# Patient Record
Sex: Female | Born: 1945 | Race: Black or African American | Hispanic: No | Smoking: Never smoker
Health system: Southern US, Community
[De-identification: ages and names within clinical notes are randomized; demographics above are authoritative.]

## PROBLEM LIST (undated history)

## (undated) DIAGNOSIS — I10 Essential (primary) hypertension: Secondary | ICD-10-CM

## (undated) DIAGNOSIS — R011 Cardiac murmur, unspecified: Secondary | ICD-10-CM

## (undated) DIAGNOSIS — M75102 Unspecified rotator cuff tear or rupture of left shoulder, not specified as traumatic: Secondary | ICD-10-CM

## (undated) DIAGNOSIS — R0789 Other chest pain: Secondary | ICD-10-CM

## (undated) DIAGNOSIS — M545 Low back pain, unspecified: Secondary | ICD-10-CM

## (undated) DIAGNOSIS — M1990 Unspecified osteoarthritis, unspecified site: Secondary | ICD-10-CM

## (undated) DIAGNOSIS — R9439 Abnormal result of other cardiovascular function study: Secondary | ICD-10-CM

## (undated) DIAGNOSIS — I8390 Asymptomatic varicose veins of unspecified lower extremity: Secondary | ICD-10-CM

## (undated) DIAGNOSIS — I82409 Acute embolism and thrombosis of unspecified deep veins of unspecified lower extremity: Secondary | ICD-10-CM

## (undated) DIAGNOSIS — I83893 Varicose veins of bilateral lower extremities with other complications: Secondary | ICD-10-CM

## (undated) DIAGNOSIS — R600 Localized edema: Secondary | ICD-10-CM

## (undated) DIAGNOSIS — E782 Mixed hyperlipidemia: Secondary | ICD-10-CM

## (undated) DIAGNOSIS — I82431 Acute embolism and thrombosis of right popliteal vein: Secondary | ICD-10-CM

## (undated) DIAGNOSIS — I83899 Varicose veins of unspecified lower extremities with other complications: Secondary | ICD-10-CM

## (undated) DIAGNOSIS — M12812 Other specific arthropathies, not elsewhere classified, left shoulder: Secondary | ICD-10-CM

## (undated) HISTORY — DX: Essential (primary) hypertension: I10

## (undated) HISTORY — PX: ABDOMINAL HYSTERECTOMY: SHX81

## (undated) HISTORY — PX: TUBAL LIGATION: SHX77

## (undated) HISTORY — DX: Acute embolism and thrombosis of unspecified deep veins of unspecified lower extremity: I82.409

## (undated) HISTORY — PX: CATARACT EXTRACTION W/ INTRAOCULAR LENS IMPLANT: SHX1309

## (undated) HISTORY — PX: JOINT REPLACEMENT: SHX530

## (undated) HISTORY — DX: Low back pain, unspecified: M54.50

## (undated) HISTORY — PX: SHOULDER ARTHROSCOPY: SHX128

## (undated) HISTORY — DX: Low back pain: M54.5

## (undated) HISTORY — PX: VARICOSE VEIN SURGERY: SHX832

---
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
## 2004-10-29 ENCOUNTER — Ambulatory Visit: Payer: Self-pay | Admitting: Family Medicine

## 2004-12-21 ENCOUNTER — Ambulatory Visit: Payer: Self-pay | Admitting: Family Medicine

## 2005-08-30 ENCOUNTER — Ambulatory Visit: Payer: Self-pay | Admitting: Family Medicine

## 2005-10-07 ENCOUNTER — Ambulatory Visit: Payer: Self-pay | Admitting: Family Medicine

## 2005-10-25 ENCOUNTER — Ambulatory Visit: Payer: Self-pay | Admitting: Family Medicine

## 2008-03-28 ENCOUNTER — Encounter: Admission: RE | Admit: 2008-03-28 | Discharge: 2008-03-28 | Payer: Self-pay | Admitting: Neurosurgery

## 2008-06-13 ENCOUNTER — Encounter: Admission: RE | Admit: 2008-06-13 | Discharge: 2008-06-13 | Payer: Self-pay | Admitting: Neurosurgery

## 2009-03-10 ENCOUNTER — Encounter: Admission: RE | Admit: 2009-03-10 | Discharge: 2009-03-10 | Payer: Self-pay | Admitting: Neurosurgery

## 2009-09-14 IMAGING — CT CT NECK WO/W CM
5 of 11 series · 14 of 33 positions shown, 15 images · IV contrast ([ID] OMNI [id number])
Comparison: MRI scan of the cervical spine Jurgina Debnath dated
 02/06/2008 and postcontrast study dated 02/09/2008.
COMPARISON: MRI scan Jurgina Debnath as described above.

March 28, 2008 –DUPLICATE COPY for exam association in [facility]. No change from original report.
CLINICAL DATA: Abnormal MRI scan. Cervical neck pain.

 CT NECK WITHOUT AND WITH CONTRAST
TECHNIQUE: Multidetector CT imaging of the neck was performed
 without and with intravenous contrast.
 Contrast: 100 ml Omnipaque 300.
TECHNIQUE: Multidetector CT imaging of the chest was performed Rautha
 Andika Budi intravenous contrast administration.
 Contrast: And 100 ml Omnipaque 300

[Series 2: cervical spine · axial · 0.23mm/px · z∈[-170,-113]mm · 2 of 137 slices shown]
[im 46/137  bone]
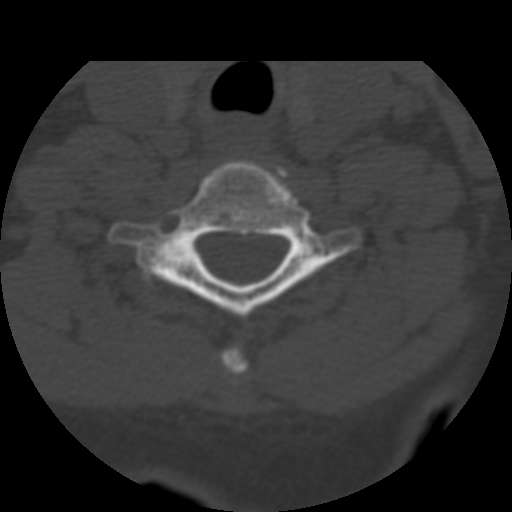
[im 91/137  bone]
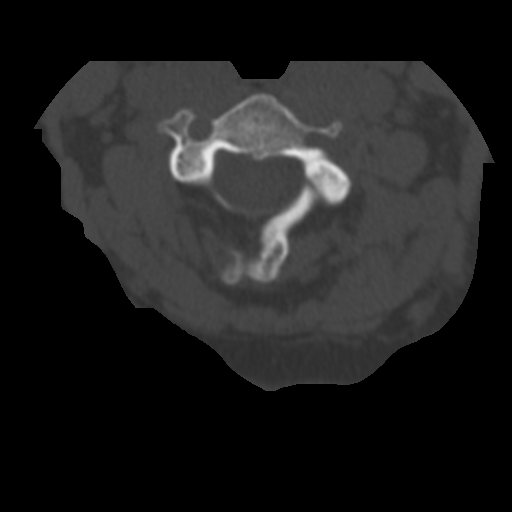

[Series 3: bone windows · axial · 0.23mm/px · z∈[-170,-113]mm · 2 of 137 slices shown]
[im 46/137  bone]
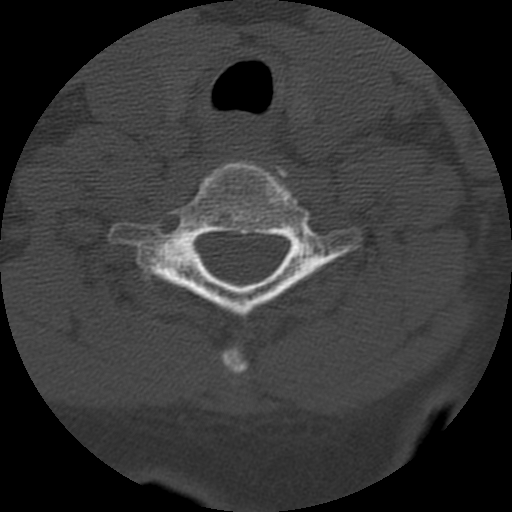
[im 91/137  bone]
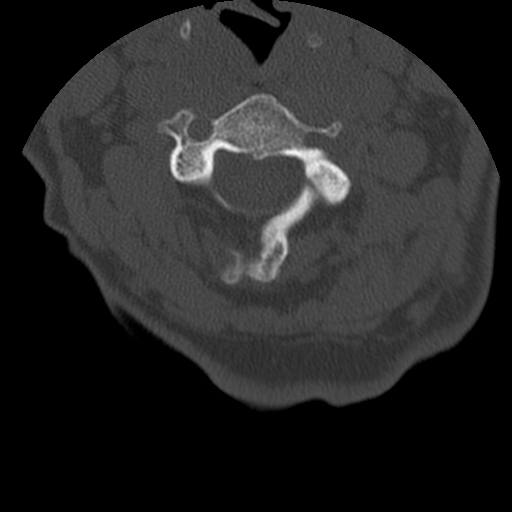

[Series 5: chest/neck · axial · 0.70mm/px · z∈[-424,-46]mm · 3 of 111 slices shown, 4 images]
[im 1/111  soft-tissue]
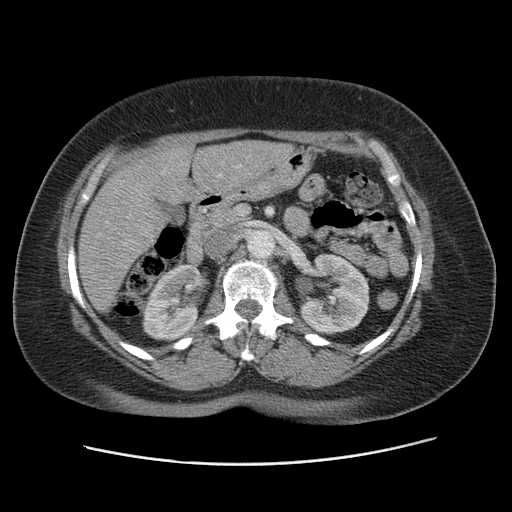
[im 1/111  bone]
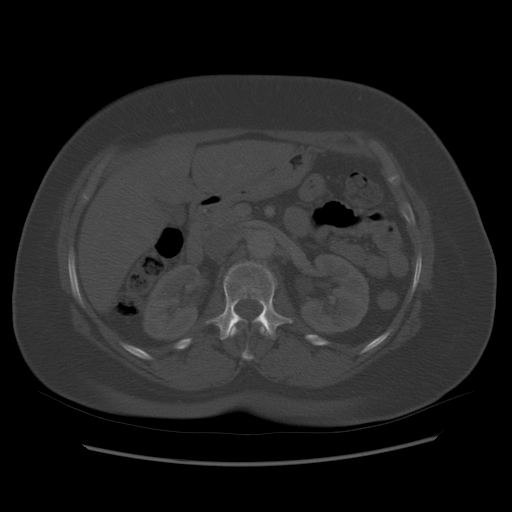
[im 56/111  bone]
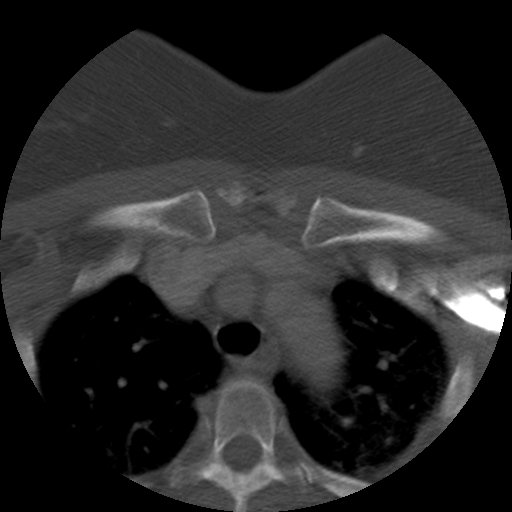
[im 111/111  bone]
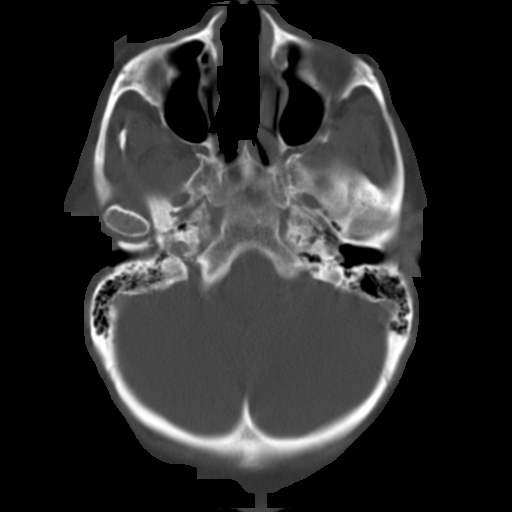

[Series 106: cor chest · coronal · 0.70mm/px · 2 of 102 slices shown]
[im 34/102  bone]
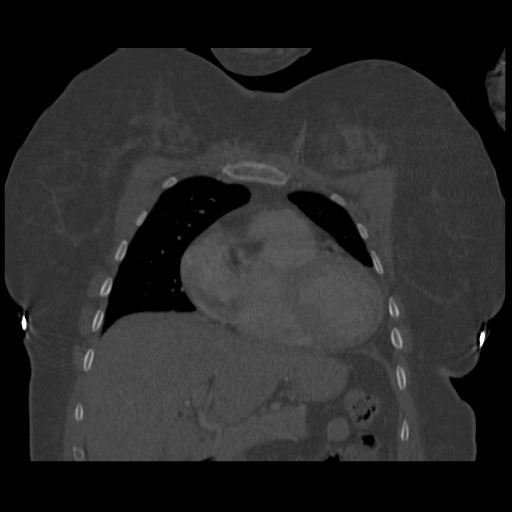
[im 68/102  bone]
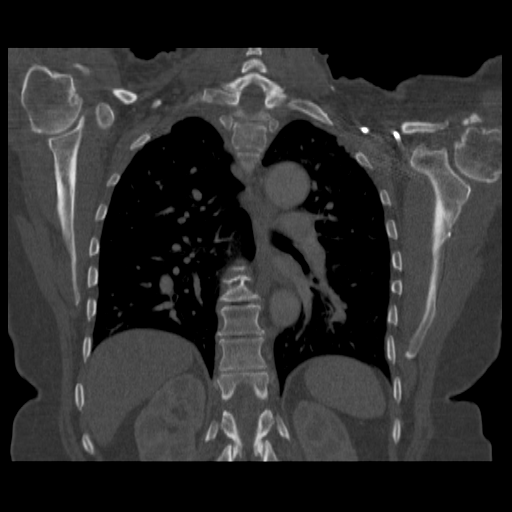

[Series 107: sag chest · sagittal · 0.70mm/px · 5 of 141 slices shown]
[im 24/141  bone]
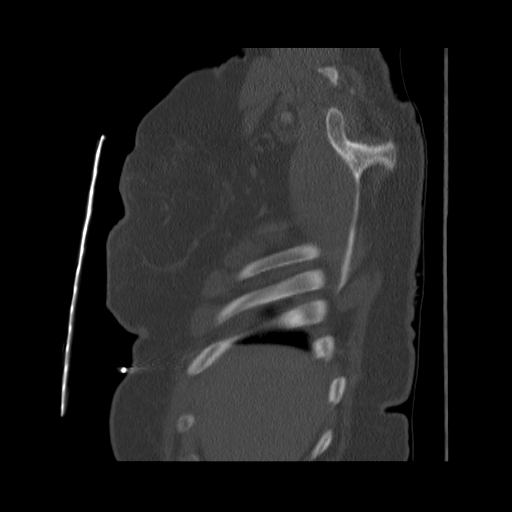
[im 47/141  bone]
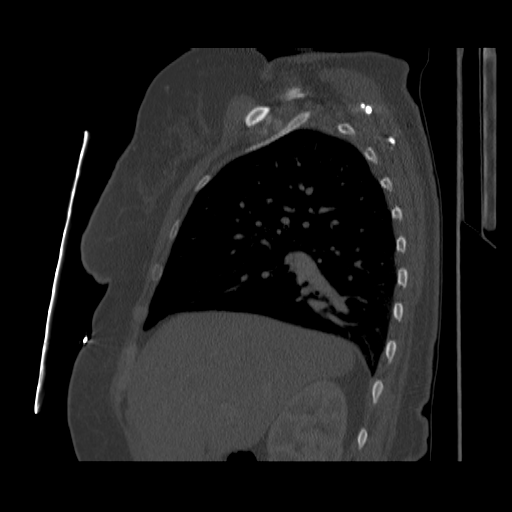
[im 71/141  bone]
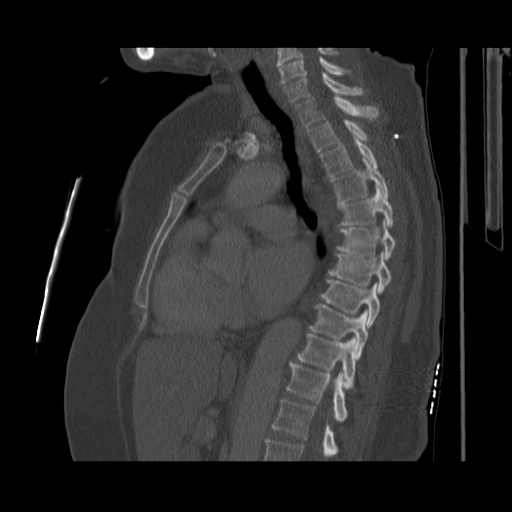
[im 94/141  bone]
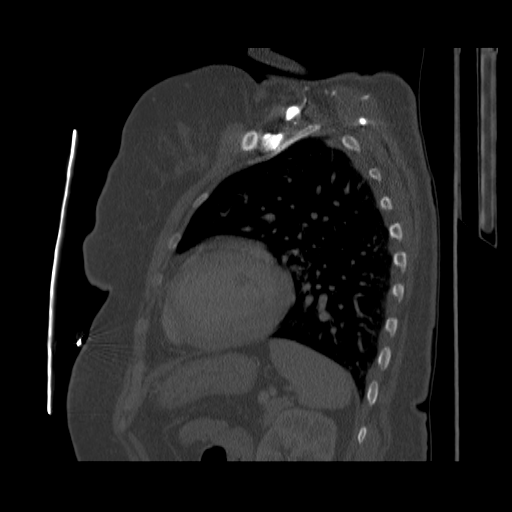
[im 117/141  bone]
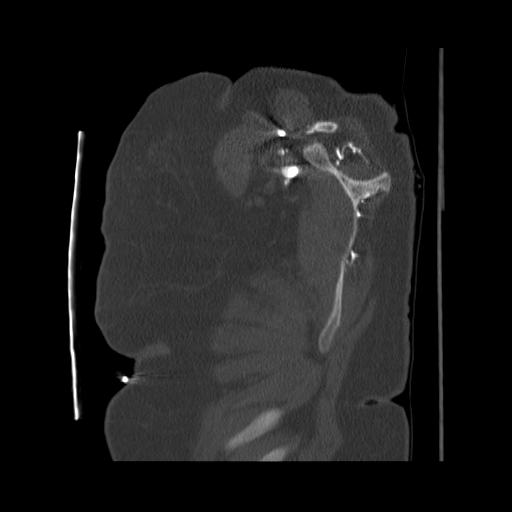

[14 of 33 positions shown; findings below may reference images not displayed]

FINDINGS: There is no evidence for apical mass on the neck CT. The
 previously identified inflammatory changes about the posterior
 elements of T1 and T2 are not well appreciated on this study. MRI
 is more sensitive and specific for such findings. No definite
 adjacent soft tissue mass is appreciated. Degenerative changes
 from C5-C7 are redemonstrated. No definite bone destruction is
 appreciated.

 The soft tissues of the neck demonstrate no significant mucosal or
 submucosal lesion. There is mild prominence of the palatine
 tonsils bilaterally without definite focal lesion. A 15 mm left
 jugulodigastric node is likely within normal limits. A smaller
 level II node is seen on the right. No other pathologically
 enlarged nodes are evident within the neck.
IMPRESSION: 1. Bone marrow and facet changes at T1-2 identified on recent MRI
 scan are not well appreciated on the study.
 2. No adjacent soft tissue mass or evidence for bone destruction.
 3. Mild cervical spondylosis as described.
 4. No other significant to soft tissue pathology in the neck.
 5. Mild prominence of the palatine tonsils bilaterally. These do
 not appear obstructive nor is there any focal lesion.

 CT CHEST WITH CONTRAST
FINDINGS: Heart size is mildly enlarged. There is no significant
 mediastinal or axillary adenopathy. No significant to apical or
 pleural based mass lesion is evident. Lungs are clear without
 focal nodule, mass, or airspace disease. Minimal dependent
 atelectasis is evident on the left. Bone windows demonstrate
 exaggerated thoracic kyphosis. No focal fracture is seen. There
 is no evidence for focal lytic or blastic lesion.
IMPRESSION: 1. No evidence for paraspinal or peri apical mass.
 2. Mild cardiomegaly without failure.
 3. No acute abnormality.

## 2010-05-13 ENCOUNTER — Ambulatory Visit: Payer: Self-pay | Admitting: Vascular Surgery

## 2010-05-13 ENCOUNTER — Encounter (INDEPENDENT_AMBULATORY_CARE_PROVIDER_SITE_OTHER): Payer: Self-pay | Admitting: Orthopedic Surgery

## 2010-05-29 ENCOUNTER — Inpatient Hospital Stay (HOSPITAL_COMMUNITY): Admission: RE | Admit: 2010-05-29 | Discharge: 2010-06-01 | Payer: Self-pay | Admitting: Orthopedic Surgery

## 2010-08-24 ENCOUNTER — Encounter: Payer: Self-pay | Admitting: Neurosurgery

## 2010-10-14 LAB — URINALYSIS, ROUTINE W REFLEX MICROSCOPIC
Bilirubin Urine: NEGATIVE
Glucose, UA: NEGATIVE mg/dL
Ketones, ur: NEGATIVE mg/dL
Leukocytes, UA: NEGATIVE
Nitrite: NEGATIVE
Protein, ur: NEGATIVE mg/dL
Specific Gravity, Urine: 1.017 (ref 1.005–1.030)
Urobilinogen, UA: 0.2 mg/dL (ref 0.0–1.0)
pH: 5 (ref 5.0–8.0)

## 2010-10-14 LAB — CBC
HCT: 26.7 % — ABNORMAL LOW (ref 36.0–46.0)
HCT: 29.5 % — ABNORMAL LOW (ref 36.0–46.0)
HCT: 30.7 % — ABNORMAL LOW (ref 36.0–46.0)
HCT: 38.6 % (ref 36.0–46.0)
Hemoglobin: 10.1 g/dL — ABNORMAL LOW (ref 12.0–15.0)
Hemoglobin: 10.6 g/dL — ABNORMAL LOW (ref 12.0–15.0)
Hemoglobin: 13.2 g/dL (ref 12.0–15.0)
Hemoglobin: 9.2 g/dL — ABNORMAL LOW (ref 12.0–15.0)
MCH: 30.1 pg (ref 26.0–34.0)
MCH: 30.1 pg (ref 26.0–34.0)
MCH: 30.8 pg (ref 26.0–34.0)
MCH: 31 pg (ref 26.0–34.0)
MCHC: 34.2 g/dL (ref 30.0–36.0)
MCHC: 34.2 g/dL (ref 30.0–36.0)
MCHC: 34.5 g/dL (ref 30.0–36.0)
MCHC: 34.5 g/dL (ref 30.0–36.0)
MCV: 88.1 fL (ref 78.0–100.0)
MCV: 88.1 fL (ref 78.0–100.0)
MCV: 89.3 fL (ref 78.0–100.0)
MCV: 89.8 fL (ref 78.0–100.0)
Platelets: 149 10*3/uL — ABNORMAL LOW (ref 150–400)
Platelets: 157 10*3/uL (ref 150–400)
Platelets: 162 10*3/uL (ref 150–400)
Platelets: 209 10*3/uL (ref 150–400)
RBC: 2.99 MIL/uL — ABNORMAL LOW (ref 3.87–5.11)
RBC: 3.35 MIL/uL — ABNORMAL LOW (ref 3.87–5.11)
RBC: 3.42 MIL/uL — ABNORMAL LOW (ref 3.87–5.11)
RBC: 4.38 MIL/uL (ref 3.87–5.11)
RDW: 13.5 % (ref 11.5–15.5)
RDW: 13.9 % (ref 11.5–15.5)
RDW: 14 % (ref 11.5–15.5)
RDW: 14.1 % (ref 11.5–15.5)
WBC: 12.8 10*3/uL — ABNORMAL HIGH (ref 4.0–10.5)
WBC: 5.5 10*3/uL (ref 4.0–10.5)
WBC: 8.6 10*3/uL (ref 4.0–10.5)
WBC: 8.9 10*3/uL (ref 4.0–10.5)

## 2010-10-14 LAB — COMPREHENSIVE METABOLIC PANEL
ALT: 13 U/L (ref 0–35)
AST: 18 U/L (ref 0–37)
Albumin: 3.4 g/dL — ABNORMAL LOW (ref 3.5–5.2)
Alkaline Phosphatase: 52 U/L (ref 39–117)
BUN: 11 mg/dL (ref 6–23)
CO2: 32 mEq/L (ref 19–32)
Calcium: 9.4 mg/dL (ref 8.4–10.5)
Chloride: 101 mEq/L (ref 96–112)
Creatinine, Ser: 0.68 mg/dL (ref 0.4–1.2)
GFR calc Af Amer: >60 mL/min (ref >60)
GFR calc non Af Amer: >60 mL/min (ref >60)
Glucose, Bld: 73 mg/dL (ref 70–99)
Potassium: 3.7 mEq/L (ref 3.5–5.1)
Sodium: 139 mEq/L (ref 135–145)
Total Bilirubin: 0.3 mg/dL (ref 0.3–1.2)
Total Protein: 7 g/dL (ref 6.0–8.3)

## 2010-10-14 LAB — URINE MICROSCOPIC-ADD ON

## 2010-10-14 LAB — URINE CULTURE
Colony Count: 6000
Culture  Setup Time: 201110271157

## 2010-10-14 LAB — BASIC METABOLIC PANEL
BUN: 6 mg/dL (ref 6–23)
BUN: 6 mg/dL (ref 6–23)
BUN: 7 mg/dL (ref 6–23)
CO2: 28 mEq/L (ref 19–32)
CO2: 29 mEq/L (ref 19–32)
CO2: 29 mEq/L (ref 19–32)
Calcium: 8.2 mg/dL — ABNORMAL LOW (ref 8.4–10.5)
Calcium: 8.2 mg/dL — ABNORMAL LOW (ref 8.4–10.5)
Calcium: 8.6 mg/dL (ref 8.4–10.5)
Chloride: 101 mEq/L (ref 96–112)
Chloride: 102 mEq/L (ref 96–112)
Chloride: 103 mEq/L (ref 96–112)
Creatinine, Ser: 0.64 mg/dL (ref 0.4–1.2)
Creatinine, Ser: 0.66 mg/dL (ref 0.4–1.2)
Creatinine, Ser: 0.71 mg/dL (ref 0.4–1.2)
GFR calc Af Amer: >60 mL/min (ref >60)
GFR calc Af Amer: >60 mL/min (ref >60)
GFR calc Af Amer: >60 mL/min (ref >60)
GFR calc non Af Amer: >60 mL/min (ref >60)
GFR calc non Af Amer: >60 mL/min (ref >60)
GFR calc non Af Amer: >60 mL/min (ref >60)
Glucose, Bld: 121 mg/dL — ABNORMAL HIGH (ref 70–99)
Glucose, Bld: 132 mg/dL — ABNORMAL HIGH (ref 70–99)
Glucose, Bld: 96 mg/dL (ref 70–99)
Potassium: 3.3 mEq/L — ABNORMAL LOW (ref 3.5–5.1)
Potassium: 3.5 mEq/L (ref 3.5–5.1)
Potassium: 3.5 mEq/L (ref 3.5–5.1)
Sodium: 135 mEq/L (ref 135–145)
Sodium: 136 mEq/L (ref 135–145)
Sodium: 137 mEq/L (ref 135–145)

## 2010-10-14 LAB — DIFFERENTIAL
Basophils Absolute: 0 10*3/uL (ref 0.0–0.1)
Basophils Relative: 0 % (ref 0–1)
Eosinophils Absolute: 0.1 10*3/uL (ref 0.0–0.7)
Eosinophils Relative: 2 % (ref 0–5)
Lymphocytes Relative: 30 % (ref 12–46)
Lymphs Abs: 1.7 10*3/uL (ref 0.7–4.0)
Monocytes Absolute: 0.4 10*3/uL (ref 0.1–1.0)
Monocytes Relative: 7 % (ref 3–12)
Neutro Abs: 3.3 10*3/uL (ref 1.7–7.7)
Neutrophils Relative %: 61 % (ref 43–77)

## 2010-10-14 LAB — ABO/RH
ABO/RH(D): B NEG
Weak D: POSITIVE

## 2010-10-14 LAB — APTT: aPTT: 31 seconds (ref 24–37)

## 2010-10-14 LAB — SURGICAL PCR SCREEN
MRSA, PCR: NEGATIVE
Staphylococcus aureus: NEGATIVE

## 2010-10-14 LAB — TYPE AND SCREEN
ABO/RH(D): B NEG
Antibody Screen: NEGATIVE
Weak D: POSITIVE

## 2010-10-14 LAB — PROTIME-INR
INR: 1.08 (ref 0.00–1.49)
Prothrombin Time: 14.2 seconds (ref 11.6–15.2)

## 2011-09-22 ENCOUNTER — Other Ambulatory Visit: Payer: Self-pay | Admitting: Neurosurgery

## 2011-09-22 DIAGNOSIS — M48061 Spinal stenosis, lumbar region without neurogenic claudication: Secondary | ICD-10-CM

## 2011-09-22 DIAGNOSIS — M545 Low back pain: Secondary | ICD-10-CM

## 2011-09-30 ENCOUNTER — Ambulatory Visit
Admission: RE | Admit: 2011-09-30 | Discharge: 2011-09-30 | Disposition: A | Discharge status: Home / Self Care | Payer: BC Managed Care – PPO | Source: Ambulatory Visit | Attending: Neurosurgery | Admitting: Neurosurgery

## 2011-09-30 DIAGNOSIS — M48061 Spinal stenosis, lumbar region without neurogenic claudication: Secondary | ICD-10-CM

## 2011-09-30 DIAGNOSIS — M545 Low back pain: Secondary | ICD-10-CM

## 2013-11-26 ENCOUNTER — Other Ambulatory Visit: Payer: Self-pay | Admitting: *Deleted

## 2013-11-26 DIAGNOSIS — I80299 Phlebitis and thrombophlebitis of other deep vessels of unspecified lower extremity: Secondary | ICD-10-CM

## 2013-11-26 DIAGNOSIS — I8000 Phlebitis and thrombophlebitis of superficial vessels of unspecified lower extremity: Secondary | ICD-10-CM

## 2013-11-26 DIAGNOSIS — M7989 Other specified soft tissue disorders: Secondary | ICD-10-CM

## 2013-11-30 ENCOUNTER — Encounter: Payer: Self-pay | Admitting: Surgery

## 2013-12-28 ENCOUNTER — Encounter: Payer: Self-pay | Admitting: Surgery

## 2013-12-31 ENCOUNTER — Encounter: Payer: Self-pay | Admitting: Surgery

## 2013-12-31 ENCOUNTER — Ambulatory Visit (HOSPITAL_COMMUNITY)
Admission: RE | Admit: 2013-12-31 | Discharge: 2013-12-31 | Disposition: A | Discharge status: Home / Self Care | Payer: BC Managed Care – PPO | Source: Ambulatory Visit | Attending: Surgery | Admitting: Surgery

## 2013-12-31 ENCOUNTER — Ambulatory Visit (INDEPENDENT_AMBULATORY_CARE_PROVIDER_SITE_OTHER): Payer: BC Managed Care – PPO | Admitting: Surgery

## 2013-12-31 VITALS — BP 144/79 | HR 59 | Ht 61.0 in | Wt 178.0 lb

## 2013-12-31 DIAGNOSIS — M7989 Other specified soft tissue disorders: Secondary | ICD-10-CM

## 2013-12-31 DIAGNOSIS — I8000 Phlebitis and thrombophlebitis of superficial vessels of unspecified lower extremity: Secondary | ICD-10-CM | POA: Insufficient documentation

## 2013-12-31 DIAGNOSIS — I80299 Phlebitis and thrombophlebitis of other deep vessels of unspecified lower extremity: Secondary | ICD-10-CM | POA: Diagnosis present

## 2013-12-31 NOTE — Progress Notes (Signed)
Patient name: Lauren Moon MRN: [id number] DOB: 01-23-1946 Sex: female   Referred by: Dr. Garlon Hatchet  Reason for referral:  Chief Complaint  Patient presents with  . New Evaluation    Lt leg edema, hx of DVT     HISTORY OF PRESENT ILLNESS: This is a 68 year old female referred for leg swelling.  The patient states that in February of 2015, she developed a blood clot.  Ultrasound shows that this was in the left popliteal vein.  She was treated with oral anticoagulation.  The goal was for 3 months however I'm not sure if she took a full 3 month course.  The patient does report one previous DVT about 10 years ago.  This was associated with a large ulcer on her left ankle which ultimately healed.  The patient still complains of significant leg swelling.  There were no obvious factors associated with her blood clot.  She was actually at work.  She denied a long travel history or prolonged sitting.  She has never had a pulmonary embolism.  The patient is medically managed for hypertension.  She is a nonsmoker.     Past Medical History  Diagnosis Date  . Hypertension   . Acute venous embolism and thrombosis of unspecified deep vessels of lower extremity   . Lumbago     Past Surgical History  Procedure Laterality Date  . Joint replacement Bilateral     knee    History   Social History  . Marital Status: Divorced    Spouse Name: N/A    Number of Children: N/A  . Years of Education: N/A   Occupational History  . Not on file.   Social History Main Topics  . Smoking status: Never Smoker   . Smokeless tobacco: Never Used  . Alcohol Use: No  . Drug Use: No  . Sexual Activity: Not on file   Other Topics Concern  . Not on file   Social History Narrative  . No narrative on file    Family History  Problem Relation Age of Onset  . Heart disease Mother     before age 67  . Hypertension Mother   . Varicose Veins Mother   . Cancer Sister   . Diabetes Sister   . Heart  disease Sister     before age 60  . Hypertension Sister   . Peripheral vascular disease Sister   . Bleeding Disorder Sister   . Cancer Brother   . Heart disease Brother     before age 25  . Hypertension Brother     Allergies as of 12/31/2013 - Review Complete 12/31/2013  Allergen Reaction Noted  . Penicillins  11/30/2013    Current Outpatient Prescriptions on File Prior to Visit  Medication Sig Dispense Refill  . Amlodipine-Valsartan-HCTZ (EXFORGE HCT) 5-160-25 MG TABS Take by mouth daily.      . Calcium Carbonate (CALTRATE 600 PO) Take by mouth 2 (two) times daily.      . diclofenac (VOLTAREN) 75 MG EC tablet Take 75 mg by mouth 2 (two) times daily.      . rivaroxaban (XARELTO) 20 MG TABS tablet Take 20 mg by mouth daily with supper.       No current facility-administered medications on file prior to visit.     REVIEW OF SYSTEMS: Please see history of present illness, otherwise all systems are negative  PHYSICAL EXAMINATION: General: The patient appears their stated age.  Vital signs are BP 144/79  Pulse 59  Ht 5\' 1"  (1.549 m)  Wt 178 lb (80.74 kg)  BMI 33.65 kg/m2  SpO2 100% HEENT:  No gross abnormalities Pulmonary: Respirations are non-labored Musculoskeletal: There are no major deformities.   Neurologic: No focal weakness or paresthesias are detected, Skin: Heel ulcer left medial malleolus. Psychiatric: The patient has normal affect. Cardiovascular: There is a regular rate and rhythm without significant murmur appreciated.  Palpable dorsalis pedis pulse bilaterally.  Edema, left leg  Diagnostic Studies:  Historical records were reviewed.  They showed ultrasound was ordered and reviewed.  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  There is deep vein reflux as well as reflux within the great saphenous and small saphenous vein on the left.  Maximum diameter of the great saphenous vein the left is  1.02.    Assessment:  Left leg DVT Plan: I discussed with the patient that she needs to undergo a full hypercoagulable workup, given that this is her second episode of deep vein thrombosis.  If this workup were to be positive, she will need lifelong anticoagulation.  I will refer to Dr. Garlon Hatchet to order these tests.  I have recommended that the patient be placed in 20-30 thigh-high compression stockings.  She will come back to our office in 3 months for consideration of laser ablation of her incompetent left greater saphenous vein.     Eldridge Abrahams, M.D. [facility] Office: [phone] Pager:  [phone]

## 2014-04-15 ENCOUNTER — Encounter: Payer: Self-pay | Admitting: Vascular Surgery

## 2014-04-16 ENCOUNTER — Other Ambulatory Visit: Payer: Self-pay | Admitting: *Deleted

## 2014-04-16 ENCOUNTER — Encounter: Payer: Self-pay | Admitting: Vascular Surgery

## 2014-04-16 ENCOUNTER — Ambulatory Visit (INDEPENDENT_AMBULATORY_CARE_PROVIDER_SITE_OTHER): Payer: BC Managed Care – PPO | Admitting: Vascular Surgery

## 2014-04-16 VITALS — BP 129/76 | HR 73 | Ht 61.0 in | Wt 171.6 lb

## 2014-04-16 DIAGNOSIS — I83893 Varicose veins of bilateral lower extremities with other complications: Secondary | ICD-10-CM

## 2014-04-16 DIAGNOSIS — I8390 Asymptomatic varicose veins of unspecified lower extremity: Secondary | ICD-10-CM

## 2014-04-16 DIAGNOSIS — M7989 Other specified soft tissue disorders: Secondary | ICD-10-CM

## 2014-04-16 HISTORY — DX: Varicose veins of bilateral lower extremities with other complications: I83.893

## 2014-04-16 HISTORY — DX: Asymptomatic varicose veins of unspecified lower extremity: I83.90

## 2014-04-16 NOTE — Progress Notes (Signed)
Subjective:     Patient ID: Lauren Moon, female   DOB: 1946-02-08, 68 y.o.   MRN: [id number]  HPI this 68 year old female returns for followup regarding her swollen left lower extremity. She has had an episode of DVT in February of this year and has evidence of partial obstruction of the left popliteal vein. She continues to have chronic edema in the left leg. She does have a remote history of a stasis ulcer in the left ankle about 10 years ago which healed. She has tried long leg elastic compression stockings 20-30 mm gradient as well as elevation and ibuprofen and this has not improved her swelling.  Past Medical History  Diagnosis Date  . Hypertension   . Acute venous embolism and thrombosis of unspecified deep vessels of lower extremity   . Lumbago     History  Substance Use Topics  . Smoking status: Never Smoker   . Smokeless tobacco: Never Used  . Alcohol Use: No    Family History  Problem Relation Age of Onset  . Heart disease Mother     before age 26  . Hypertension Mother   . Varicose Veins Mother   . Cancer Sister   . Diabetes Sister   . Heart disease Sister     before age 70  . Hypertension Sister   . Peripheral vascular disease Sister   . Bleeding Disorder Sister   . Cancer Brother   . Heart disease Brother     before age 72  . Hypertension Brother     Allergies  Allergen Reactions  . Penicillins     Current outpatient prescriptions:Amlodipine-Valsartan-HCTZ (EXFORGE HCT) 5-160-25 MG TABS, Take by mouth daily., Disp: , Rfl: ;  aspirin 81 MG tablet, Take 81 mg by mouth daily., Disp: , Rfl: ;  Calcium Carbonate (CALTRATE 600 PO), Take by mouth 2 (two) times daily., Disp: , Rfl: ;  diclofenac (VOLTAREN) 75 MG EC tablet, Take 75 mg by mouth 2 (two) times daily., Disp: , Rfl:  Multiple Vitamins-Minerals (MULTIVITAMIN WITH MINERALS) tablet, Take 1 tablet by mouth daily., Disp: , Rfl: ;  rivaroxaban (XARELTO) 20 MG TABS tablet, Take 20 mg by mouth daily with  supper., Disp: , Rfl:   BP 129/76  Pulse 73  Ht 5\' 1"  (1.549 m)  Wt 171 lb 9.6 oz (77.837 kg)  BMI 32.44 kg/m2  SpO2 100%  Body mass index is 32.44 kg/(m^2).           Review of Systems chest pain, dyspnea on exertion, PND, orthopnea, or hemoptysis.     Objective:   Physical Exam BP 129/76  Pulse 73  Ht 5\' 1"  (1.549 m)  Wt 171 lb 9.6 oz (77.837 kg)  BMI 32.44 kg/m2  SpO2 100%  General well-developed well-nourished female in no apparent stress alert and oriented x3 Left leg with chronic 1+ edema. Severe hyperpigmentation lower third left leg and evidence of healed ulcer medially. A few bulging varicosities in the medial thigh. 3+ dorsalis pedis pulse palpable.  There is evidence of gross reflux in the left great saphenous vein from the mid thigh up to the saphenofemoral junction and it is a large caliber fine. 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 Small saphenous vein is also incompetent.      Assessment:     Chronic swelling left leg with previous DVT and gross reflux left grade and small saphenous veins with partially  obstructive chronic left popliteal thrombus    Plan:     Plan leave patient should have laser ablation left great saphenous vein from midthigh up to near saphenofemoral junction to help relieve edema. Do not believe small saphenous vein should be treated because of its proximity to popliteal vein which has had DVT in the past. Will proceed with presurgical and to perform this in the near future.

## 2014-05-06 ENCOUNTER — Ambulatory Visit (INDEPENDENT_AMBULATORY_CARE_PROVIDER_SITE_OTHER): Payer: BC Managed Care – PPO | Admitting: Vascular Surgery

## 2014-05-06 ENCOUNTER — Encounter: Payer: Self-pay | Admitting: Vascular Surgery

## 2014-05-06 VITALS — BP 151/84 | HR 58 | Resp 16 | Ht 61.0 in | Wt 170.0 lb

## 2014-05-06 DIAGNOSIS — I83899 Varicose veins of unspecified lower extremities with other complications: Secondary | ICD-10-CM

## 2014-05-06 DIAGNOSIS — I83892 Varicose veins of left lower extremities with other complications: Secondary | ICD-10-CM

## 2014-05-06 HISTORY — DX: Varicose veins of unspecified lower extremity with other complications: I83.899

## 2014-05-06 NOTE — Progress Notes (Signed)
Subjective:     Patient ID: Lauren Moon, female   DOB: [birth year], 68 y.o.   MRN: [id number]  HPI this 68 year old female had laser ablation of the left great saphenous vein from the mid thigh to near the saphenofemoral junction performed under local tumescent anesthesia. A total of 735 J of energy were utilized. She tolerated the procedure well.  Review of Systems     Objective:   Physical Exam BP 151/84  Pulse 58  Resp 16  Ht 5\' 1"  (1.549 m)  Wt 170 lb (77.111 kg)  BMI 32.14 kg/m2       Assessment:     Well-tolerated laser ablation left great saphenous vein from mid thigh to near saphenofemoral junction performed under local tumescent anesthesia    Plan:     Return in one week for venous duplex exam left leg confirm closure left great saphenous vein This Will complete patient's treatment regimen

## 2014-05-06 NOTE — Progress Notes (Signed)
   Laser Ablation Procedure      Date: 05/06/2014    Lauren Moon DOB:16-Feb-1946  Consent signed: Yes  Surgeon:J.D. Kellie Simmering  Procedure: Laser Ablation: left Greater Saphenous Vein  BP 151/84  Pulse 58  Resp 16  Ht 5\' 1"  (1.549 m)  Wt 170 lb (77.111 kg)  BMI 32.14 kg/m2  Start time: 2:50   End time: 3:45  Tumescent Anesthesia: 200 cc 0.9% NaCl with 50 cc Lidocaine HCL with 1% Epi and 15 cc 8.4% NaHCO3  Local Anesthesia: 5 cc Lidocaine HCL and NaHCO3 (ratio 2:1)  Pulsed mode:15 watts, 563ms delay, 1.0 duration and Total energy: 733.53, Total pulses: 49, Total time: :49     Patient tolerated procedure well: Yes  Notes:   Description of Procedure:  After marking the course of the secondary varicosities, the patient was placed on the operating table in the supine position, and the left leg was prepped and draped in sterile fashion.   Local anesthetic was administered and under ultrasound guidance the saphenous vein was accessed with a micro needle and guide wire; then the mirco puncture sheath was place.  A guide wire was inserted saphenofemoral junction , followed by a 5 french sheath.  The position of the sheath and then the laser fiber below the junction was confirmed using the ultrasound.  Tumescent anesthesia was administered along the course of the saphenous vein using ultrasound guidance. The patient was placed in Trendelenburg position and protective laser glasses were placed on patient and staff, and the laser was fired at 15 watt pulsed mode advancing 1-2 mm per sec for a total of 733.53 joules.     Steri strips were applied to the stab wounds and ABD pads and thigh high compression stockings were applied.  Ace wrap bandages were applied over the phlebectomy sites and at the top of the saphenofemoral junction. Blood loss was less than 15 cc.  The patient ambulated out of the operating room having tolerated the procedure well.

## 2014-05-07 ENCOUNTER — Telehealth: Payer: Self-pay | Admitting: *Deleted

## 2014-05-07 NOTE — Telephone Encounter (Signed)
Pt doing well. Checked with her doctor and she can take Ibuprofen. Following all instructions. Reminded her of her fu appt next week.

## 2014-05-10 ENCOUNTER — Encounter: Payer: Self-pay | Admitting: Vascular Surgery

## 2014-05-13 ENCOUNTER — Encounter: Payer: Self-pay | Admitting: Vascular Surgery

## 2014-05-13 ENCOUNTER — Ambulatory Visit (HOSPITAL_COMMUNITY)
Admission: RE | Admit: 2014-05-13 | Discharge: 2014-05-13 | Disposition: A | Discharge status: Home / Self Care | Payer: BC Managed Care – PPO | Source: Ambulatory Visit | Attending: Vascular Surgery | Admitting: Vascular Surgery

## 2014-05-13 ENCOUNTER — Ambulatory Visit (INDEPENDENT_AMBULATORY_CARE_PROVIDER_SITE_OTHER): Payer: BC Managed Care – PPO | Admitting: Vascular Surgery

## 2014-05-13 VITALS — BP 151/81 | HR 58 | Resp 16 | Ht 61.0 in | Wt 170.0 lb

## 2014-05-13 DIAGNOSIS — I83892 Varicose veins of left lower extremities with other complications: Secondary | ICD-10-CM | POA: Insufficient documentation

## 2014-05-13 DIAGNOSIS — I8393 Asymptomatic varicose veins of bilateral lower extremities: Secondary | ICD-10-CM

## 2014-05-13 NOTE — Progress Notes (Signed)
Subjective:     Patient ID: Lauren Moon, female   DOB: 09/23/1945, 68 y.o.   MRN: [id number]  HPI this 68 year old female returns 1 week post laser ablation left great saphenous vein from midthigh to near the saphenofemoral junction performed for chronic venous hypertension with severe distal skin changes and chronic edema and pain. She states her leg feels much better already with less heaviness and tightness from the knee distally. She has been wearing her elastic compression stockings and elevating her leg and taking ibuprofen as instructed.  Past Medical History  Diagnosis Date  . Hypertension   . Acute venous embolism and thrombosis of unspecified deep vessels of lower extremity   . Lumbago     History  Substance Use Topics  . Smoking status: Never Smoker   . Smokeless tobacco: Never Used  . Alcohol Use: No    Family History  Problem Relation Age of Onset  . Heart disease Mother     before age 65  . Hypertension Mother   . Varicose Veins Mother   . Cancer Sister   . Diabetes Sister   . Heart disease Sister     before age 19  . Hypertension Sister   . Peripheral vascular disease Sister   . Bleeding Disorder Sister   . Cancer Brother   . Heart disease Brother     before age 31  . Hypertension Brother     Allergies  Allergen Reactions  . Penicillins     Current outpatient prescriptions:Amlodipine-Valsartan-HCTZ (EXFORGE HCT) 5-160-25 MG TABS, Take by mouth daily., Disp: , Rfl: ;  aspirin 81 MG tablet, Take 81 mg by mouth daily., Disp: , Rfl: ;  Calcium Carbonate (CALTRATE 600 PO), Take by mouth 2 (two) times daily., Disp: , Rfl: ;  diclofenac (VOLTAREN) 75 MG EC tablet, Take 75 mg by mouth 2 (two) times daily., Disp: , Rfl:  Multiple Vitamins-Minerals (MULTIVITAMIN WITH MINERALS) tablet, Take 1 tablet by mouth daily., Disp: , Rfl: ;  rivaroxaban (XARELTO) 20 MG TABS tablet, Take 20 mg by mouth daily with supper., Disp: , Rfl:   BP 151/81  Pulse 58  Resp 16  Ht  5\' 1"  (1.549 m)  Wt 170 lb (77.111 kg)  BMI 32.14 kg/m2  Body mass index is 32.14 kg/(m^2).          Review of Systems chest pain, dyspnea on exertion, PND, orthopnea, hemoptysis.     Objective:   Physical Exam BP 151/81  Pulse 58  Resp 16  Ht 5\' 1"  (1.549 m)  Wt 170 lb (77.111 kg)  BMI 32.14 kg/m2  General a well-developed well-nourished female in no apparent stress alert and oriented x3 Lungs no rhonchi or wheezing Cardiovascular regular rhythm no murmurs Left leg with moderate tenderness from mid thigh to near the saphenofemoral junction were ablation occurred. Mild ecchymosis. Left calf is soft with severe hyperpigmentation lower third of leg and 3+ dorsalis pedis pulse palpable. No ulcer noted.  Ordered a venous duplex exam of the left leg which I reviewed and interpreted. There is no change in the left chronic partially obstructive thrombus at the popliteal vein level and otherwise no DVT is noted. There is total closure of the left great saphenous vein to near the saphenofemoral junction.     Assessment:     Successful laser ablation left great saphenous vein for reflux with chronic skin changes and edema and pain    Plan:     Return to see [place] on  when necessary basis

## 2014-05-14 ENCOUNTER — Ambulatory Visit: Payer: BC Managed Care – PPO | Admitting: Vascular Surgery

## 2014-05-14 ENCOUNTER — Encounter (HOSPITAL_COMMUNITY): Payer: BC Managed Care – PPO

## 2014-07-04 ENCOUNTER — Ambulatory Visit (INDEPENDENT_AMBULATORY_CARE_PROVIDER_SITE_OTHER): Payer: BC Managed Care – PPO

## 2014-07-04 VITALS — BP 136/87 | HR 74 | Resp 12

## 2014-07-04 DIAGNOSIS — B351 Tinea unguium: Secondary | ICD-10-CM

## 2014-07-04 DIAGNOSIS — L603 Nail dystrophy: Secondary | ICD-10-CM

## 2014-07-04 MED ORDER — TAVABOROLE 5 % EX SOLN: CUTANEOUS | Status: DC

## 2014-07-04 NOTE — Patient Instructions (Signed)
Onychomycosis/Fungal Toenails  WHAT IS IT? An infection that lies within the keratin of your nail plate that is caused by a fungus.  WHY ME? Fungal infections affect all ages, sexes, races, and creeds.  There may be many factors that predispose you to a fungal infection such as age, coexisting medical conditions such as diabetes, or an autoimmune disease; stress, medications, fatigue, genetics, etc.  Bottom line: fungus thrives in a warm, moist environment and your shoes offer such a location.  IS IT CONTAGIOUS? Theoretically, yes.  You do not want to share shoes, nail clippers or files with someone who has fungal toenails.  Walking around barefoot in the same room or sleeping in the same bed is unlikely to transfer the organism.  It is important to realize, however, that fungus can spread easily from one nail to the next on the same foot.  HOW DO WE TREAT THIS?  There are several ways to treat this condition.  Treatment may depend on many factors such as age, medications, pregnancy, liver and kidney conditions, etc.  It is best to ask your doctor which options are available to you.  1. No treatment.   Unlike many other medical concerns, you can live with this condition.  However for many people this can be a painful condition and may lead to ingrown toenails or a bacterial infection.  It is recommended that you keep the nails cut short to help reduce the amount of fungal nail. 2. Topical treatment.  These range from herbal remedies to prescription strength nail lacquers.  About 40-50% effective, topicals require twice daily application for approximately 9 to 12 months or until an entirely new nail has grown out.  The most effective topicals are medical grade medications available through physicians offices. 3. Oral antifungal medications.  With an 80-90% cure rate, the most common oral medication requires 3 to 4 months of therapy and stays in your system for a year as the new nail grows out.  Oral  antifungal medications do require blood work to make sure it is a safe drug for you.  A liver function panel will be performed prior to starting the medication and after the first month of treatment.  It is important to have the blood work performed to avoid any harmful side effects.  In general, this medication safe but blood work is required. 4. Laser Therapy.  This treatment is performed by applying a specialized laser to the affected nail plate.  This therapy is noninvasive, fast, and non-painful.  It is not covered by insurance and is therefore, out of pocket.  The results have been very good with a 80-95% cure rate.  The [facility] is the only practice in the area to offer this therapy. 5. Permanent Nail Avulsion.  Removing the entire nail so that a new nail will not grow back.  Apply Cranford Mon as instructed once daily to each affected toenail for 12 months duration/the medicine will be delivered directly to your home. Make arrangements with the pharmacy by phone as instructed

## 2014-07-04 NOTE — Progress Notes (Signed)
   Subjective:    Patient ID: Lauren Moon, female    DOB: 10/27/1945, 68 y.o.   MRN: [id number]  HPI  PT STATED B/L GREAT TOENIAL ARE DRY, HAVE DISCOLORATION, AND THICK FOR 2 YEARS. THE TOENAILS ARE GETTING WORSE AND LOOKS THICKER BUT DOES NOT HURT. TRIED OTC FUNGI NAIL BUT NO HELP.  Review of Systems  All other systems reviewed and are negative.      Objective:   Physical Exam 68 year old female well-developed well-nourished oriented 3 presents at this time with a complaint of thickening discoloration of hallux nails nonpainful however the become dystrophic difficult to cut there even brittle crumbly and a fallen off in the past. Patient is requesting treatment at this time and assessment lower extremity objective findings reveal pedal pulses palpable DP and PT +2 over 4 bilateral capillary refill time 3 seconds all digits epicritic and proprioceptive sensations intact and symmetric bilateral there is normal plantar response and DTRs. Dermatologic the skin color pigment normal there is a hyperpigmented scar medial ankle malleolar area on the left ankle with history of venous insufficiency venous stasis ulceration. No active wounds or ulcers are identified. Nails brittle [place]rum[place] friable dystrophic hallux bilateral no severe second third digits not as severe but involved the fourth and fifth digits minimally involved. Orthopedic biomechanical exam reveals rectus foot type mild flexible digital contractures noted no open wounds or ulcers are identified       Assessment & Plan:  Assessment this time suspect onychomycosis and nail dystrophy nail unit clippings are debrided the nails are debrided back to the most proximal point of the nailbed where samples of the proximal nail clipping and nailbed were excised and debrided without the need for anesthesia. These nail clippings were and nail units were submitted for pathology analysis for PAS stain culture and identification. Patient will initiate  topical antifungal therapy prescription for Cranford Mon is given at this time and [facility] to [facility]'s home. Patient will start applying topical antifungal Kerydin daily as instructed reappointed 6 months for long-term follow-up will be contacted with the results of her fungal cultures with those are available. Based on clinical findings onychomycosis will initiate treatment will verify with fungal cultures that were taken at this time from nail unit biopsy. Biopsy both hallux nails carried out at this time. Next  Harriet Masson DPM

## 2014-07-04 NOTE — Addendum Note (Signed)
Addended by: Elta Guadeloupe on: 07/04/2014 02:47 PM   Modules accepted: Orders

## 2014-10-24 ENCOUNTER — Ambulatory Visit (INDEPENDENT_AMBULATORY_CARE_PROVIDER_SITE_OTHER): Payer: BLUE CROSS/BLUE SHIELD

## 2014-10-24 VITALS — BP 130/70 | HR 67 | Resp 18

## 2014-10-24 DIAGNOSIS — S90122A Contusion of left lesser toe(s) without damage to nail, initial encounter: Secondary | ICD-10-CM

## 2014-10-24 DIAGNOSIS — M2042 Other hammer toe(s) (acquired), left foot: Secondary | ICD-10-CM

## 2014-10-24 DIAGNOSIS — B351 Tinea unguium: Secondary | ICD-10-CM | POA: Diagnosis not present

## 2014-10-24 DIAGNOSIS — R52 Pain, unspecified: Secondary | ICD-10-CM

## 2014-10-24 DIAGNOSIS — Q828 Other specified congenital malformations of skin: Secondary | ICD-10-CM

## 2014-10-24 DIAGNOSIS — L603 Nail dystrophy: Secondary | ICD-10-CM | POA: Diagnosis not present

## 2014-10-24 NOTE — Progress Notes (Signed)
   Subjective:    Patient ID: Lauren Moon, female    DOB: 03/01/1946, 69 y.o.   MRN: [id number]  HPI I HIT MY 2ND TOE ON MY LEFT FOOT WITH THE CORNER OF THE DRESSER ABOUT A MONTH AGO AND HURTS WHEN MY TOE HITS THE END OF MY SHOE AND PAIN ON TOP OF MY FOOT AND SORE AND TENDER AND USES ALCOHOL AND VERY PAINFUL AND SOME WHAT SWOLLEN    Review of Systems  All other systems reviewed and are negative.      Objective:   Physical Exam 69 year old have recommended female well-developed well-nourished oriented 3 presents at this time with a history of an injury about a month or 2 ago bumped her second toe left foot since that time is been painful she's been running with alcohol however the toe is somewhat swollen and inflamed from that fit area and there is no discharge drainage no open wounds or ulcers however there is keratoses medially at the proximal IP joint. There is keratoses between the adjacent hallux and second digit due to bony prominence at the IP joint. Patient has semirigid contracture of toes on both feet left more so than right with hammering of the second toe. Patient may have a contusion of that toe however x-rays AP lateral oblique views are reviewed reveal no fracture no other osseous abnormality some mild asymmetric joint space narrowing first MTP joint generalized osteopenic changes are noted a mild inferior calcaneal spur fascial thickening adductovarus rotation contractures of lesser digits 234 and 5 are noted on the left foot. Pedal pulses are palpable DP +2 PT plus one over 4 Refill time 3 seconds all digits.      Assessment & Plan:  Assessment this time is contusion of toe second left. Patient also has onychomycosis for which she is applying topical antifungal as previously prescribed as applying [place] Mon and will continue to do so. Patient at this time does have keratoses medial surfaces second toe at the IP joint this is debrided some tube foam padding is dispensed to  cushion keep the toes separated patient wearing accommodative shoes at all times. 2 foam pad may be helpful however she continues to have difficulties or recurrence more aggressive or invasive options may need be considered consistent even hammertoe repair in the future if symptoms persist or recur. The keratotic lesion debridement intact and foam sleeve pads are dispensed follow-up in the future on an as-needed basis  Harriet Masson DPM

## 2015-01-02 ENCOUNTER — Ambulatory Visit: Payer: Medicare Other

## 2015-01-06 ENCOUNTER — Encounter: Payer: Self-pay | Admitting: Podiatry

## 2015-01-06 ENCOUNTER — Ambulatory Visit (INDEPENDENT_AMBULATORY_CARE_PROVIDER_SITE_OTHER): Payer: BLUE CROSS/BLUE SHIELD | Admitting: Podiatry

## 2015-01-06 VITALS — BP 123/79 | HR 68 | Resp 18

## 2015-01-06 DIAGNOSIS — B351 Tinea unguium: Secondary | ICD-10-CM

## 2015-01-06 DIAGNOSIS — R52 Pain, unspecified: Secondary | ICD-10-CM

## 2015-01-06 DIAGNOSIS — M79672 Pain in left foot: Secondary | ICD-10-CM

## 2015-01-06 NOTE — Progress Notes (Signed)
Subjective:     Patient ID: Lauren Moon, female   DOB: 1945/08/03, 69 y.o.   MRN: [id number]  HPI  This patient presents to the office for her long thick painful nails.  She says she was seen a year ago and given keradin for treatment of the nails.  She says it has not helped.  Her nails have grown thick and long and are painful walking and wearing her shoes.  She presents for evaluation and treatment.   Review of Systems     Objective:   Physical Exam  Neurovascular status intact.  Long thick painful nails  1-5 both feet.  No sings of infections or ulcers.     Assessment:     Onychomycosis     Plan:     Debridement of mycotic Nails 1-5 B/L

## 2015-04-09 ENCOUNTER — Ambulatory Visit (INDEPENDENT_AMBULATORY_CARE_PROVIDER_SITE_OTHER): Payer: Medicare Other | Admitting: Podiatry

## 2015-04-09 ENCOUNTER — Encounter: Payer: Self-pay | Admitting: Podiatry

## 2015-04-09 DIAGNOSIS — B351 Tinea unguium: Secondary | ICD-10-CM | POA: Diagnosis not present

## 2015-04-09 DIAGNOSIS — M79609 Pain in unspecified limb: Secondary | ICD-10-CM

## 2015-04-09 DIAGNOSIS — M79673 Pain in unspecified foot: Secondary | ICD-10-CM | POA: Diagnosis not present

## 2015-04-09 NOTE — Progress Notes (Signed)
Subjective:     Patient ID: Lauren Moon, female   DOB: May 17, 1946, 69 y.o.   MRN: [id number]  HPI  This patient presents to the office for her long thick painful nails.  She says she was seen a year ago and given keradin for treatment of the nails.  She says it has not helped.  Her nails have grown thick and long and are painful walking and wearing her shoes.  She presents for evaluation and treatment.   Review of Systems     Objective:   Physical Exam  Neurovascular status intact.  Long thick painful nails  1-5 both feet.  No sings of infections or ulcers.     Assessment:     Onychomycosis     Plan:     Debridement of mycotic Nails 1-5 B/L

## 2015-07-09 ENCOUNTER — Ambulatory Visit: Payer: Medicare Other | Admitting: Podiatry

## 2015-07-09 ENCOUNTER — Ambulatory Visit: Payer: Medicare Other | Admitting: Sports Medicine

## 2015-07-10 ENCOUNTER — Ambulatory Visit: Payer: Medicare Other | Admitting: Sports Medicine

## 2015-07-17 ENCOUNTER — Encounter: Payer: Self-pay | Admitting: Sports Medicine

## 2015-07-17 ENCOUNTER — Ambulatory Visit (INDEPENDENT_AMBULATORY_CARE_PROVIDER_SITE_OTHER): Payer: Medicare Other | Admitting: Sports Medicine

## 2015-07-17 DIAGNOSIS — L853 Xerosis cutis: Secondary | ICD-10-CM

## 2015-07-17 DIAGNOSIS — B351 Tinea unguium: Secondary | ICD-10-CM | POA: Diagnosis not present

## 2015-07-17 DIAGNOSIS — M79609 Pain in unspecified limb: Principal | ICD-10-CM

## 2015-07-17 DIAGNOSIS — M79672 Pain in left foot: Secondary | ICD-10-CM

## 2015-07-17 DIAGNOSIS — L84 Corns and callosities: Secondary | ICD-10-CM | POA: Diagnosis not present

## 2015-07-17 DIAGNOSIS — M79671 Pain in right foot: Secondary | ICD-10-CM | POA: Diagnosis not present

## 2015-07-17 DIAGNOSIS — M21619 Bunion of unspecified foot: Secondary | ICD-10-CM

## 2015-07-17 DIAGNOSIS — M2040 Other hammer toe(s) (acquired), unspecified foot: Secondary | ICD-10-CM

## 2015-07-17 NOTE — Progress Notes (Signed)
Patient ID: Lauren Moon, female   DOB: 1946-03-29, 69 y.o.   MRN: [id number] Subjective: Lauren Moon is a 69 y.o. female patient seen today in office with complaint of painful thickened and elongated toenails; unable to trim and callus that is present in between her toe that is tender.  Patient denies history of Diabetes, Neuropathy, or Vascular disease. Patient has no other pedal complaints at this time.   Patient Active Problem List   Diagnosis Date Noted  . Varicose veins of leg with complications 0000000  . Varicose veins of lower extremities with other complications [id number]  . Swelling of limb 12/31/2013   Current Outpatient Prescriptions on File Prior to Visit  Medication Sig Dispense Refill  . amLODipine-valsartan (EXFORGE) 5-160 MG per tablet     . Amlodipine-Valsartan-HCTZ (EXFORGE HCT) 5-160-25 MG TABS Take by mouth daily.    Marland Kitchen aspirin 81 MG tablet Take 81 mg by mouth daily.    . Calcium Carbonate (CALTRATE 600 PO) Take by mouth 2 (two) times daily.    . diclofenac (VOLTAREN) 75 MG EC tablet Take 75 mg by mouth 2 (two) times daily.    . Multiple Vitamins-Minerals (MULTIVITAMIN WITH MINERALS) tablet Take 1 tablet by mouth daily.    Marland Kitchen nystatin-triamcinolone (MYCOLOG II) cream     . rivaroxaban (XARELTO) 20 MG TABS tablet Take 20 mg by mouth daily with supper.    Corrie Dandy (KERYDIN) 5 % SOLN Apply one drop to affected nail once daily for 12 months as instructed 10 mL 5   No current facility-administered medications on file prior to visit.   Allergies  Allergen Reactions  . Penicillins    Objective: Physical Exam  General: Well developed, nourished, no acute distress, awake, alert and oriented x 3  Vascular: Dorsalis pedis artery 2/4 bilateral, Posterior tibial artery 1/4 bilateral, skin temperature warm to warm proximal to distal bilateral lower extremities, mild varicosities, pedal hair present bilateral.  Neurological: Gross sensation present via light  touch bilateral.   Dermatological: Skin is warm, dry, and supple bilateral, Nails 1-10 are tender, long, thick, and discolored with mild subungal debris, no webspace macerations present bilateral, no open lesions present bilateral, + hyperkeratotic tissue present at medial aspect of left 2nd toe from toes rubbing with no underlying opening or signs of infection bilateral.  Musculoskeletal: Bunion and hammertoes noted bilateral. Muscular strength within normal limits without pain on range of motion. No pain with calf compression bilateral.  Assessment and Plan:  Problem List Items Addressed This Visit    None    Visit Diagnoses    Pain due to onychomycosis of nail    -  Primary    Callus of foot        Left 2nd toe medial aspect    Bunion        Hammer toe, unspecified laterality        Foot pain, bilateral        Dry skin          -Examined patient.  -Discussed treatment options for painful mycotic nails and callus. -Mechanically debrided callus x 1 using sterile chisel blade and reduced mycotic nails with sterile nail nipper and dremel nail file without incident. -Gave patient silicone toe pad for left 2nd toe to use daily  -Recommend good supportive shoes daily for foot type -Recommend skin emollients for dryness -Patient to return in 3 months/as needed for follow up evaluation or sooner if symptoms worsen. Will have a more detailed  discussion and xray of both feet at next visit; Patient reports that bunions bother her and she wants to consider having them "taken off".   Landis Martins, DPM

## 2015-10-15 ENCOUNTER — Ambulatory Visit (INDEPENDENT_AMBULATORY_CARE_PROVIDER_SITE_OTHER): Payer: Medicare Other

## 2015-10-15 ENCOUNTER — Ambulatory Visit (INDEPENDENT_AMBULATORY_CARE_PROVIDER_SITE_OTHER): Payer: BLUE CROSS/BLUE SHIELD | Admitting: Sports Medicine

## 2015-10-15 ENCOUNTER — Encounter: Payer: Self-pay | Admitting: Sports Medicine

## 2015-10-15 DIAGNOSIS — L853 Xerosis cutis: Secondary | ICD-10-CM

## 2015-10-15 DIAGNOSIS — M79673 Pain in unspecified foot: Secondary | ICD-10-CM

## 2015-10-15 DIAGNOSIS — M21619 Bunion of unspecified foot: Secondary | ICD-10-CM | POA: Diagnosis not present

## 2015-10-15 DIAGNOSIS — M79609 Pain in unspecified limb: Secondary | ICD-10-CM

## 2015-10-15 DIAGNOSIS — B351 Tinea unguium: Secondary | ICD-10-CM

## 2015-10-15 NOTE — Progress Notes (Signed)
Patient ID: Lauren Moon, female   DOB: May 06, 1946, 70 y.o.   MRN: [id number]  Subjective: Lauren Moon is a 70 y.o. female patient seen today in office with complaint of painful thickened and elongated toenails; unable to trim. Patient states that her callus areas are doing better, especially with silicone toe cover that was given at last appointment. Patient desires to also discuss further surgical options for her bunion. States that the top of her left bunion hurts especially when trying to wear shoes or standing long periods of time with noted swelling greater than the right bunion. Patient desires to have surgical correction in the near future.  Patient denies history of Diabetes, Neuropathy, or Vascular disease. Denies recent change in medical history. Patient has no other pedal complaints at this time.   Patient Active Problem List   Diagnosis Date Noted  . Varicose veins of leg with complications 0000000  . Varicose veins of lower extremities with other complications [id number]  . Swelling of limb 12/31/2013   Current Outpatient Prescriptions on File Prior to Visit  Medication Sig Dispense Refill  . amLODipine-valsartan (EXFORGE) 5-160 MG per tablet     . Amlodipine-Valsartan-HCTZ (EXFORGE HCT) 5-160-25 MG TABS Take by mouth daily.    Marland Kitchen aspirin 81 MG tablet Take 81 mg by mouth daily.    . Calcium Carbonate (CALTRATE 600 PO) Take by mouth 2 (two) times daily.    . diclofenac (VOLTAREN) 75 MG EC tablet Take 75 mg by mouth 2 (two) times daily.    . Multiple Vitamins-Minerals (MULTIVITAMIN WITH MINERALS) tablet Take 1 tablet by mouth daily.    Marland Kitchen nystatin-triamcinolone (MYCOLOG II) cream     . Tavaborole (KERYDIN) 5 % SOLN Apply one drop to affected nail once daily for 12 months as instructed 10 mL 5   No current facility-administered medications on file prior to visit.   Allergies  Allergen Reactions  . Penicillins   . Penicillin G Rash    Past Surgical History  Procedure  Laterality Date  . Joint replacement Bilateral     knee   Family History  Problem Relation Age of Onset  . Heart disease Mother     before age 65  . Hypertension Mother   . Varicose Veins Mother   . Cancer Sister   . Diabetes Sister   . Heart disease Sister     before age 42  . Hypertension Sister   . Peripheral vascular disease Sister   . Bleeding Disorder Sister   . Cancer Brother   . Heart disease Brother     before age 71  . Hypertension Brother    Social History   Social History  . Marital Status: Divorced    Spouse Name: N/A  . Number of Children: N/A  . Years of Education: N/A   Occupational History  . Not on file.   Social History Main Topics  . Smoking status: Never Smoker   . Smokeless tobacco: Never Used  . Alcohol Use: No  . Drug Use: No  . Sexual Activity: Not on file   Other Topics Concern  . Not on file   Social History Narrative   Works at [employer]  Objective: Physical Exam  General: Well developed, nourished, no acute distress, awake, alert and oriented x 3  Vascular: Dorsalis pedis artery 2/4 bilateral, Posterior tibial artery 1/4 bilateral, skin temperature warm to warm proximal to distal bilateral lower extremities, mild varicosities, scant pedal hair present bilateral.  Neurological: Gross  sensation present via light touch bilateral.   Dermatological: Skin is warm, dry, and supple bilateral, Nails 1-10 are tender, long, thick, and discolored with moderate subungal debris, no webspace macerations present bilateral, no open lesions present bilateral, very minimal hyperkeratotic tissue present at medial aspect of left 2nd toe from toes rubbing that is much improved since use of silicone toe pad with no underlying opening or signs of infection bilateral.  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 Muscular  strength within normal limits without pain. No pain with calf compression bilateral. Pes planus foot type bilateral.  X-rays right and left foot: Osseous mineralization within normal limits. There is significant pes planus deformity with worse metatarsophalangeal joint space narrowing and dorsal flag sign on left and hallux valgus with deviation of the first metatarsal, suggestive of bunion on right, there is significant midtarsal breach with generalized osteoarthritis. There are calcaneal spurs present. No fracture or dislocation. Soft tissues within normal limits  Assessment and Plan:  Problem List Items Addressed This Visit    None    Visit Diagnoses    Foot pain, unspecified laterality    -  Primary    Relevant Orders    DG Foot 2 Views Left    DG Foot 2 Views Right    Pain due to onychomycosis of nail        Dry skin        Bunion          -Examined patient.  -Discussed treatment options for painful mycotic nails  -Mechanically debrided and reduced mycotic nails with sterile nail nipper and dremel nail file without incident. -Cont with silicone toe pad for left 2nd toe to use daily  -Recommend good supportive shoes daily for foot type -Recommend skin emollients for dryness -Xrays ordered and reviewed; discussed and educated patient on type of bunion deformity that she has patient states that the left one currently hurts more than the right one, thus further discussed with patient implant versus fusion. The patient states that she would prefer to have an implant. Patient desires to have surgical management. Consent obtain for left first metatarsophalangeal joint implant for arthritis and bunion/hallux rigidus deformity. Pre and Post op course explained. Risks, benefits, alternatives explained. No guarantees given or implied. Surgical booking slip submitted and provided patient with Surgical packet.Patient desires to have surgery done at [facility] sometime in July. Advised  patient that in the meantime, we will work on getting this scheduled for a time frame that is convenient for her in July. Dispensed CAM Walkerto use post op.  -Patient to return in 3 months for routine nail care until time of surgery or sooner if symptoms worsen.   Landis Martins, DPM

## 2015-10-15 NOTE — Patient Instructions (Signed)
Pre-Operative Instructions  Congratulations, you have decided to take an important step to improving your quality of life.  You can be assured that the doctors of [facility] will be with you every step of the way.  1. Plan to be at the surgery center/hospital at least 1 (one) hour prior to your scheduled time unless otherwise directed by the surgical center/hospital staff.  You must have a responsible adult accompany you, remain during the surgery and drive you home.  Make sure you have directions to the surgical center/hospital and know how to get there on time. 2. For hospital based surgery you will need to obtain a history and physical form from your family physician within 1 month prior to the date of surgery- we will give you a form for you primary physician.  3. We make every effort to accommodate the date you request for surgery.  There are however, times where surgery dates or times have to be moved.  We will contact you as soon as possible if a change in schedule is required.   4. No Aspirin/Ibuprofen for one week before surgery.  If you are on aspirin, any non-steroidal anti-inflammatory medications (Mobic, Aleve, Ibuprofen) you should stop taking it 7 days prior to your surgery.  You make take Tylenol  For pain prior to surgery.  5. Medications- If you are taking daily heart and blood pressure medications, seizure, reflux, allergy, asthma, anxiety, pain or diabetes medications, make sure the surgery center/hospital is aware before the day of surgery so they may notify you which medications to take or avoid the day of surgery. 6. No food or drink after midnight the night before surgery unless directed otherwise by surgical center/hospital staff. 7. No alcoholic beverages 24 hours prior to surgery.  No smoking 24 hours prior to or 24 hours after surgery. 8. Wear loose pants or shorts- loose enough to fit over bandages, boots, and casts. 9. No slip on shoes, sneakers are best. 10. Bring  your boot with you to the surgery center/hospital.  Also bring crutches or a walker if your physician has prescribed it for you.  If you do not have this equipment, it will be provided for you after surgery. 11. If you have not been contracted by the surgery center/hospital by the day before your surgery, call to confirm the date and time of your surgery. 12. Leave-time from work may vary depending on the type of surgery you have.  Appropriate arrangements should be made prior to surgery with your employer. 13. Prescriptions will be provided immediately following surgery by your doctor.  Have these filled as soon as possible after surgery and take the medication as directed. 14. Remove nail polish on the operative foot. 15. Wash the night before surgery.  The night before surgery wash the foot and leg well with the antibacterial soap provided and water paying special attention to beneath the toenails and in between the toes.  Rinse thoroughly with water and dry well with a towel.  Perform this wash unless told not to do so by your physician.  Enclosed: 1 Ice pack (please put in freezer the night before surgery)   1 Hibiclens skin cleaner   Pre-op Instructions  If you have any questions regarding the instructions, do not hesitate to call our office.  [place]: [address] [place], [place] [address] [phone]  [place]: [address] [place], [place] [address] [phone]  [place]: [address]  [place], [place] [address] [phone]  Dr. Richard   Tuchman DPM, Dr. Norman Regal DPM Dr. Richard Sikora DPM, Dr. M. Todd Hyatt DPM, Dr. Suzanne Kho DPM 

## 2015-11-05 DIAGNOSIS — Z131 Encounter for screening for diabetes mellitus: Secondary | ICD-10-CM | POA: Insufficient documentation

## 2015-11-05 DIAGNOSIS — E782 Mixed hyperlipidemia: Secondary | ICD-10-CM | POA: Insufficient documentation

## 2015-11-05 DIAGNOSIS — I10 Essential (primary) hypertension: Secondary | ICD-10-CM | POA: Insufficient documentation

## 2015-11-05 HISTORY — DX: Mixed hyperlipidemia: E78.2

## 2015-11-05 HISTORY — DX: Essential (primary) hypertension: I10

## 2015-12-23 ENCOUNTER — Telehealth: Payer: Self-pay | Admitting: *Deleted

## 2015-12-23 NOTE — Telephone Encounter (Signed)
Per Dr. Cannon Kettle, I called to see if patient wanted to have surgery in July.  "I'm going to have to hold off on that right now.  My rotator cuff is giving me a fit.  I want to wait until the weather cools off and see what I'm going to have to do about this shoulder."  Okay, that is fine.  I'll let Dr. Cannon Kettle know.  Take care of yourself.

## 2016-06-23 ENCOUNTER — Other Ambulatory Visit: Payer: Self-pay | Admitting: Orthopedic Surgery

## 2016-06-23 DIAGNOSIS — M25512 Pain in left shoulder: Secondary | ICD-10-CM

## 2016-07-01 ENCOUNTER — Ambulatory Visit
Admission: RE | Admit: 2016-07-01 | Discharge: 2016-07-01 | Disposition: A | Discharge status: Home / Self Care | Payer: Medicare Other | Source: Ambulatory Visit | Attending: Orthopedic Surgery | Admitting: Orthopedic Surgery

## 2016-07-01 DIAGNOSIS — M25512 Pain in left shoulder: Secondary | ICD-10-CM

## 2016-09-01 DIAGNOSIS — M1990 Unspecified osteoarthritis, unspecified site: Secondary | ICD-10-CM | POA: Insufficient documentation

## 2016-09-01 DIAGNOSIS — M67919 Unspecified disorder of synovium and tendon, unspecified shoulder: Secondary | ICD-10-CM | POA: Insufficient documentation

## 2016-09-01 DIAGNOSIS — M8580 Other specified disorders of bone density and structure, unspecified site: Secondary | ICD-10-CM | POA: Insufficient documentation

## 2016-09-01 DIAGNOSIS — M545 Low back pain, unspecified: Secondary | ICD-10-CM | POA: Insufficient documentation

## 2016-09-30 HISTORY — PX: TOTAL SHOULDER ARTHROPLASTY: SHX126

## 2016-10-06 ENCOUNTER — Other Ambulatory Visit: Payer: Self-pay | Admitting: Orthopedic Surgery

## 2016-10-06 ENCOUNTER — Encounter (HOSPITAL_COMMUNITY): Payer: Self-pay

## 2016-10-08 ENCOUNTER — Encounter (HOSPITAL_COMMUNITY)
Admission: RE | Admit: 2016-10-08 | Discharge: 2016-10-08 | Disposition: A | Discharge status: Home / Self Care | Payer: BLUE CROSS/BLUE SHIELD | Source: Ambulatory Visit | Attending: Orthopedic Surgery | Admitting: Orthopedic Surgery

## 2016-10-08 ENCOUNTER — Encounter (HOSPITAL_COMMUNITY): Payer: Self-pay

## 2016-10-08 DIAGNOSIS — Z01818 Encounter for other preprocedural examination: Secondary | ICD-10-CM | POA: Diagnosis not present

## 2016-10-08 HISTORY — DX: Cardiac murmur, unspecified: R01.1

## 2016-10-08 HISTORY — DX: Unspecified osteoarthritis, unspecified site: M19.90

## 2016-10-08 LAB — BASIC METABOLIC PANEL
Anion gap: 8 (ref 5–15)
BUN: 12 mg/dL (ref 6–20)
CALCIUM: 9.8 mg/dL (ref 8.9–10.3)
CO2: 26 mmol/L (ref 22–32)
CREATININE: 0.88 mg/dL (ref 0.44–1.00)
Chloride: 108 mmol/L (ref 101–111)
GFR calc non Af Amer: >60 mL/min (ref >60)
GLUCOSE: 130 mg/dL — AB (ref 65–99)
Potassium: 3.9 mmol/L (ref 3.5–5.1)
Sodium: 142 mmol/L (ref 135–145)

## 2016-10-08 LAB — CBC
HCT: 37.3 % (ref 36.0–46.0)
Hemoglobin: 12.7 g/dL (ref 12.0–15.0)
MCH: 30.8 pg (ref 26.0–34.0)
MCHC: 34 g/dL (ref 30.0–36.0)
MCV: 90.3 fL (ref 78.0–100.0)
PLATELETS: 214 10*3/uL (ref 150–400)
RBC: 4.13 MIL/uL (ref 3.87–5.11)
RDW: 14.1 % (ref 11.5–15.5)
WBC: 6.9 10*3/uL (ref 4.0–10.5)

## 2016-10-08 LAB — SURGICAL PCR SCREEN
MRSA, PCR: NEGATIVE
STAPHYLOCOCCUS AUREUS: NEGATIVE

## 2016-10-08 NOTE — Progress Notes (Signed)
PCP: Dr. Teressa Lower  No cardiologist Pt states she has had an ECHO done a while ago by Dr. Garlon Hatchet and EKG recently. These records requested.   Pt denies cardiac hx other than heart murmur. Denies chest pain, SOB or signs of infection at PAT appointment.

## 2016-10-08 NOTE — Pre-Procedure Instructions (Signed)
Lauren Moon  10/08/2016      [facility] [address] [place] [address] [place] [address] Phone: [phone] Fax: [name], [name] Phone: [phone] Fax: [phone]    Your procedure is scheduled on Tuesday March 20.  Report to [facility] Admitting at 5:30 A.M.  Call this number if you have problems the morning of surgery:  [phone]   Remember:  Do not eat food or drink liquids after midnight.  Take these medicines the morning of surgery with A SIP OF WATER: tramadol (ultram) if needed  7 days prior to surgery STOP taking any Aspirin, diclofenac (voltaren), Aleve, Naproxen, Ibuprofen, Motrin, Advil, Goody's, BC's, all herbal medications, fish oil, and all vitamins    Do not wear jewelry, make-up or nail polish.  Do not wear lotions, powders, or perfumes, or deoderant.  Do not shave 48 hours prior to surgery.  Men may shave face and neck.  Do not bring valuables to the hospital.  [facility] is not responsible for any belongings or valuables.  Contacts, dentures or bridgework may not be worn into surgery.  Leave your suitcase in the car.  After surgery it may be brought to your room.  For patients admitted to the hospital, discharge time will be determined by your treatment team.  Patients discharged the day of surgery will not be allowed to drive home.    Special instructions:    [facility]- Preparing For Surgery  Before surgery, you can play an important role. Because skin is not sterile, your skin needs to be as free of germs as possible. You can reduce the number of germs on your skin by washing with CHG (chlorahexidine gluconate) Soap before surgery.  CHG is an antiseptic cleaner which kills germs and bonds with the skin to continue killing germs even after washing.  Please do not use if you have an allergy  to CHG or antibacterial soaps. If your skin becomes reddened/irritated stop using the CHG.  Do not shave (including legs and underarms) for at least 48 hours prior to first CHG shower. It is OK to shave your face.  Please follow these instructions carefully.   1. Shower the NIGHT BEFORE SURGERY and the MORNING OF SURGERY with CHG.   2. If you chose to wash your hair, wash your hair first as usual with your normal shampoo.  3. After you shampoo, rinse your hair and body thoroughly to remove the shampoo.  4. Use CHG as you would any other liquid soap. You can apply CHG directly to the skin and wash gently with a scrungie or a clean washcloth.   5. Apply the CHG Soap to your body ONLY FROM THE NECK DOWN.  Do not use on open wounds or open sores. Avoid contact with your eyes, ears, mouth and genitals (private parts). Wash genitals (private parts) with your normal soap.  6. Wash thoroughly, paying special attention to the area where your surgery will be performed.  7. Thoroughly rinse your body with warm water from the neck down.  8. DO NOT shower/wash with your normal soap after using and rinsing off the CHG Soap.  9. Pat yourself dry with a CLEAN TOWEL.   10. Wear CLEAN PAJAMAS   11. Place CLEAN SHEETS on your bed the night of your first shower and DO NOT SLEEP WITH  PETS.    Day of Surgery: Do not apply any deodorants/lotions. Please wear clean clothes to the hospital/surgery center.      Please read over the following fact sheets that you were given. MRSA Information

## 2016-10-18 NOTE — Progress Notes (Signed)
Re-requested last OV/echo/stress/EKG

## 2016-10-18 NOTE — Anesthesia Preprocedure Evaluation (Addendum)
Anesthesia Evaluation  Patient identified by MRN, date of birth, ID band Patient awake    Reviewed: Allergy & Precautions, NPO status , Patient's Chart, lab work & pertinent test results  History of Anesthesia Complications Negative for: history of anesthetic complications  Airway Mallampati: II  TM Distance: >3 FB Neck ROM: Full    Dental  (+) Dental Advisory Given, Upper Dentures, Partial Lower   Pulmonary neg pulmonary ROS,    Pulmonary exam normal        Cardiovascular hypertension, Pt. on medications Normal cardiovascular exam     Neuro/Psych negative neurological ROS  negative psych ROS   GI/Hepatic negative GI ROS, Neg liver ROS,   Endo/Other  negative endocrine ROS  Renal/GU negative Renal ROS     Musculoskeletal   Abdominal   Peds  Hematology negative hematology ROS (+)   Anesthesia Other Findings   Reproductive/Obstetrics                           Anesthesia Physical Anesthesia Plan  ASA: II  Anesthesia Plan: General   Post-op Pain Management:  Regional for Post-op pain   Induction: Intravenous  Airway Management Planned: Oral ETT  Additional Equipment:   Intra-op Plan:   Post-operative Plan: Extubation in OR  Informed Consent: I have reviewed the patients History and Physical, chart, labs and discussed the procedure including the risks, benefits and alternatives for the proposed anesthesia with the patient or authorized representative who has indicated his/her understanding and acceptance.   Dental advisory given  Plan Discussed with: CRNA, Anesthesiologist and Surgeon  Anesthesia Plan Comments:        Anesthesia Quick Evaluation

## 2016-10-19 ENCOUNTER — Inpatient Hospital Stay (HOSPITAL_COMMUNITY)
Admission: RE | Admit: 2016-10-19 | Discharge: 2016-10-20 | DRG: 483 | Disposition: A | Discharge status: Home / Self Care | Payer: BLUE CROSS/BLUE SHIELD | Source: Ambulatory Visit | Attending: Orthopedic Surgery | Admitting: Orthopedic Surgery

## 2016-10-19 ENCOUNTER — Inpatient Hospital Stay (HOSPITAL_COMMUNITY): Payer: BLUE CROSS/BLUE SHIELD

## 2016-10-19 ENCOUNTER — Encounter (HOSPITAL_COMMUNITY)
Admission: RE | Disposition: A | Discharge status: Home / Self Care | Payer: Self-pay | Source: Ambulatory Visit | Attending: Orthopedic Surgery

## 2016-10-19 ENCOUNTER — Inpatient Hospital Stay (HOSPITAL_COMMUNITY): Payer: BLUE CROSS/BLUE SHIELD | Admitting: Anesthesiology

## 2016-10-19 ENCOUNTER — Encounter (HOSPITAL_COMMUNITY): Payer: Self-pay | Admitting: *Deleted

## 2016-10-19 DIAGNOSIS — M25512 Pain in left shoulder: Secondary | ICD-10-CM | POA: Diagnosis present

## 2016-10-19 DIAGNOSIS — Z79899 Other long term (current) drug therapy: Secondary | ICD-10-CM

## 2016-10-19 DIAGNOSIS — Z7982 Long term (current) use of aspirin: Secondary | ICD-10-CM | POA: Diagnosis not present

## 2016-10-19 DIAGNOSIS — Z86718 Personal history of other venous thrombosis and embolism: Secondary | ICD-10-CM

## 2016-10-19 DIAGNOSIS — M12812 Other specific arthropathies, not elsewhere classified, left shoulder: Secondary | ICD-10-CM

## 2016-10-19 DIAGNOSIS — M19012 Primary osteoarthritis, left shoulder: Secondary | ICD-10-CM | POA: Diagnosis present

## 2016-10-19 DIAGNOSIS — I10 Essential (primary) hypertension: Secondary | ICD-10-CM | POA: Diagnosis present

## 2016-10-19 DIAGNOSIS — Z96619 Presence of unspecified artificial shoulder joint: Secondary | ICD-10-CM

## 2016-10-19 DIAGNOSIS — M75102 Unspecified rotator cuff tear or rupture of left shoulder, not specified as traumatic: Secondary | ICD-10-CM

## 2016-10-19 DIAGNOSIS — Z96653 Presence of artificial knee joint, bilateral: Secondary | ICD-10-CM | POA: Diagnosis present

## 2016-10-19 HISTORY — DX: Unspecified rotator cuff tear or rupture of left shoulder, not specified as traumatic: M75.102

## 2016-10-19 HISTORY — DX: Other specific arthropathies, not elsewhere classified, left shoulder: M12.812

## 2016-10-19 HISTORY — PX: TOTAL SHOULDER ARTHROPLASTY: SHX126

## 2016-10-19 LAB — GRAM STAIN

## 2016-10-19 SURGERY — ARTHROPLASTY, SHOULDER, TOTAL
Anesthesia: General | Laterality: Left

## 2016-10-19 MED ORDER — PHENYLEPHRINE HCL 10 MG/ML IJ SOLN
INTRAVENOUS | Status: DC | PRN
  Administered 2016-10-19: 25 ug/min via INTRAVENOUS

## 2016-10-19 MED ORDER — ONDANSETRON HCL 4 MG PO TABS: 4.0000 mg | ORAL_TABLET | Freq: Four times a day (QID) | ORAL | Status: DC | PRN

## 2016-10-19 MED ORDER — POLYETHYLENE GLYCOL 3350 17 G PO PACK: 17.0000 g | PACK | Freq: Every day | ORAL | Status: DC | PRN

## 2016-10-19 MED ORDER — GLYCOPYRROLATE 0.2 MG/ML IV SOSY
PREFILLED_SYRINGE | INTRAVENOUS | Status: DC | PRN
  Administered 2016-10-19: .2 mg via INTRAVENOUS

## 2016-10-19 MED ORDER — TRAMADOL HCL 50 MG PO TABS
50.0000 mg | ORAL_TABLET | Freq: Two times a day (BID) | ORAL | Status: DC | PRN
  Administered 2016-10-20: 50 mg via ORAL
  Filled 2016-10-19: qty 1

## 2016-10-19 MED ORDER — SODIUM CHLORIDE 0.9 % IJ SOLN
INTRAMUSCULAR | Status: AC
  Filled 2016-10-19: qty 10

## 2016-10-19 MED ORDER — PHENOL 1.4 % MT LIQD: 1.0000 | OROMUCOSAL | Status: DC | PRN

## 2016-10-19 MED ORDER — ONDANSETRON HCL 4 MG/2ML IJ SOLN
INTRAMUSCULAR | Status: AC
  Filled 2016-10-19: qty 2

## 2016-10-19 MED ORDER — PROMETHAZINE HCL 25 MG/ML IJ SOLN: 6.2500 mg | INTRAMUSCULAR | Status: DC | PRN

## 2016-10-19 MED ORDER — OXYCODONE HCL 5 MG PO TABS: 5.0000 mg | ORAL_TABLET | ORAL | 0 refills | Status: DC | PRN

## 2016-10-19 MED ORDER — DIPHENHYDRAMINE HCL 50 MG/ML IJ SOLN
INTRAMUSCULAR | Status: AC
  Filled 2016-10-19: qty 1

## 2016-10-19 MED ORDER — PROPOFOL 10 MG/ML IV BOLUS
INTRAVENOUS | Status: DC | PRN
  Administered 2016-10-19: 130 mg via INTRAVENOUS
  Administered 2016-10-19: 40 mg via INTRAVENOUS

## 2016-10-19 MED ORDER — SUGAMMADEX SODIUM 200 MG/2ML IV SOLN
INTRAVENOUS | Status: DC | PRN
  Administered 2016-10-19: 200 mg via INTRAVENOUS

## 2016-10-19 MED ORDER — ONDANSETRON HCL 4 MG PO TABS: 4.0000 mg | ORAL_TABLET | Freq: Three times a day (TID) | ORAL | 0 refills | Status: DC | PRN

## 2016-10-19 MED ORDER — CEFAZOLIN SODIUM-DEXTROSE 2-4 GM/100ML-% IV SOLN
INTRAVENOUS | Status: AC
  Administered 2016-10-19: 2000 mg
  Filled 2016-10-19: qty 100

## 2016-10-19 MED ORDER — MIDAZOLAM HCL 2 MG/2ML IJ SOLN
INTRAMUSCULAR | Status: AC
  Filled 2016-10-19: qty 2

## 2016-10-19 MED ORDER — BACLOFEN 10 MG PO TABS: 10.0000 mg | ORAL_TABLET | Freq: Three times a day (TID) | ORAL | 0 refills | Status: DC

## 2016-10-19 MED ORDER — ROCURONIUM BROMIDE 10 MG/ML (PF) SYRINGE
PREFILLED_SYRINGE | INTRAVENOUS | Status: DC | PRN
  Administered 2016-10-19: 50 mg via INTRAVENOUS

## 2016-10-19 MED ORDER — ADULT MULTIVITAMIN W/MINERALS CH
1.0000 | ORAL_TABLET | Freq: Every day | ORAL | Status: DC
  Administered 2016-10-20: 1 via ORAL
  Filled 2016-10-19: qty 1

## 2016-10-19 MED ORDER — CEFAZOLIN SODIUM 1 G IJ SOLR
INTRAMUSCULAR | Status: AC
  Filled 2016-10-19: qty 20

## 2016-10-19 MED ORDER — HYDROMORPHONE HCL 1 MG/ML IJ SOLN: 0.5000 mg | INTRAMUSCULAR | Status: DC | PRN

## 2016-10-19 MED ORDER — AMLODIPINE BESYLATE-VALSARTAN 5-160 MG PO TABS: 1.0000 | ORAL_TABLET | Freq: Every day | ORAL | Status: DC

## 2016-10-19 MED ORDER — 0.9 % SODIUM CHLORIDE (POUR BTL) OPTIME
TOPICAL | Status: DC | PRN
  Administered 2016-10-19: 1000 mL

## 2016-10-19 MED ORDER — METOCLOPRAMIDE HCL 5 MG PO TABS: 5.0000 mg | ORAL_TABLET | Freq: Three times a day (TID) | ORAL | Status: DC | PRN

## 2016-10-19 MED ORDER — CEFAZOLIN SODIUM-DEXTROSE 2-4 GM/100ML-% IV SOLN: 2.0000 g | INTRAVENOUS | Status: DC

## 2016-10-19 MED ORDER — POTASSIUM CHLORIDE IN NACL 20-0.45 MEQ/L-% IV SOLN
INTRAVENOUS | Status: DC
  Administered 2016-10-19: 17:00:00 via INTRAVENOUS
  Filled 2016-10-19 (×2): qty 1000

## 2016-10-19 MED ORDER — FENTANYL CITRATE (PF) 100 MCG/2ML IJ SOLN
INTRAMUSCULAR | Status: AC
  Filled 2016-10-19: qty 2

## 2016-10-19 MED ORDER — BISACODYL 10 MG RE SUPP: 10.0000 mg | Freq: Every day | RECTAL | Status: DC | PRN

## 2016-10-19 MED ORDER — ROCURONIUM BROMIDE 50 MG/5ML IV SOSY
PREFILLED_SYRINGE | INTRAVENOUS | Status: AC
  Filled 2016-10-19: qty 5

## 2016-10-19 MED ORDER — ALUM & MAG HYDROXIDE-SIMETH 200-200-20 MG/5ML PO SUSP: 30.0000 mL | ORAL | Status: DC | PRN

## 2016-10-19 MED ORDER — AMLODIPINE BESYLATE 5 MG PO TABS
5.0000 mg | ORAL_TABLET | Freq: Every day | ORAL | Status: DC
  Administered 2016-10-19: 5 mg via ORAL
  Filled 2016-10-19 (×3): qty 1

## 2016-10-19 MED ORDER — MENTHOL 3 MG MT LOZG: 1.0000 | LOZENGE | OROMUCOSAL | Status: DC | PRN

## 2016-10-19 MED ORDER — ONDANSETRON HCL 4 MG/2ML IJ SOLN
INTRAMUSCULAR | Status: DC | PRN
  Administered 2016-10-19: 4 mg via INTRAVENOUS

## 2016-10-19 MED ORDER — NALOXONE HCL 0.4 MG/ML IJ SOLN
INTRAMUSCULAR | Status: AC
  Filled 2016-10-19: qty 1

## 2016-10-19 MED ORDER — SENNA 8.6 MG PO TABS
1.0000 | ORAL_TABLET | Freq: Two times a day (BID) | ORAL | Status: DC
  Administered 2016-10-19 – 2016-10-20 (×2): 8.6 mg via ORAL
  Filled 2016-10-19 (×2): qty 1

## 2016-10-19 MED ORDER — DEXAMETHASONE SODIUM PHOSPHATE 10 MG/ML IJ SOLN
INTRAMUSCULAR | Status: AC
  Filled 2016-10-19: qty 1

## 2016-10-19 MED ORDER — BUPIVACAINE HCL (PF) 0.5 % IJ SOLN
INTRAMUSCULAR | Status: AC
  Filled 2016-10-19: qty 10

## 2016-10-19 MED ORDER — PROPOFOL 10 MG/ML IV BOLUS
INTRAVENOUS | Status: AC
  Filled 2016-10-19: qty 20

## 2016-10-19 MED ORDER — CEFAZOLIN IN D5W 1 GM/50ML IV SOLN
1.0000 g | Freq: Four times a day (QID) | INTRAVENOUS | Status: AC
  Administered 2016-10-19 – 2016-10-20 (×3): 1 g via INTRAVENOUS
  Filled 2016-10-19 (×4): qty 50

## 2016-10-19 MED ORDER — METHOCARBAMOL 1000 MG/10ML IJ SOLN
500.0000 mg | Freq: Four times a day (QID) | INTRAVENOUS | Status: DC | PRN
  Filled 2016-10-19: qty 5

## 2016-10-19 MED ORDER — MIDAZOLAM HCL 2 MG/2ML IJ SOLN
INTRAMUSCULAR | Status: DC | PRN
  Administered 2016-10-19 (×2): 1 mg via INTRAVENOUS

## 2016-10-19 MED ORDER — ACETAMINOPHEN 650 MG RE SUPP: 650.0000 mg | Freq: Four times a day (QID) | RECTAL | Status: DC | PRN

## 2016-10-19 MED ORDER — ACETAMINOPHEN 325 MG PO TABS
650.0000 mg | ORAL_TABLET | Freq: Four times a day (QID) | ORAL | Status: DC | PRN
  Administered 2016-10-20 (×2): 650 mg via ORAL
  Filled 2016-10-19 (×2): qty 2

## 2016-10-19 MED ORDER — CEFAZOLIN SODIUM 1 G IJ SOLR
INTRAMUSCULAR | Status: DC | PRN
  Administered 2016-10-19: 2 g via INTRAMUSCULAR

## 2016-10-19 MED ORDER — SUGAMMADEX SODIUM 200 MG/2ML IV SOLN
INTRAVENOUS | Status: AC
  Filled 2016-10-19: qty 2

## 2016-10-19 MED ORDER — LACTATED RINGERS IV SOLN
INTRAVENOUS | Status: DC | PRN
Start: 2016-10-19 — End: 2016-10-19
  Administered 2016-10-19 (×2): via INTRAVENOUS

## 2016-10-19 MED ORDER — DOCUSATE SODIUM 100 MG PO CAPS
100.0000 mg | ORAL_CAPSULE | Freq: Two times a day (BID) | ORAL | Status: DC
  Administered 2016-10-19 – 2016-10-20 (×2): 100 mg via ORAL
  Filled 2016-10-19 (×2): qty 1

## 2016-10-19 MED ORDER — MAGNESIUM CITRATE PO SOLN: 1.0000 | Freq: Once | ORAL | Status: DC | PRN

## 2016-10-19 MED ORDER — SENNA-DOCUSATE SODIUM 8.6-50 MG PO TABS: 2.0000 | ORAL_TABLET | Freq: Every day | ORAL | 1 refills | Status: DC

## 2016-10-19 MED ORDER — FENTANYL CITRATE (PF) 100 MCG/2ML IJ SOLN
INTRAMUSCULAR | Status: DC | PRN
  Administered 2016-10-19 (×4): 50 ug via INTRAVENOUS

## 2016-10-19 MED ORDER — PHENYLEPHRINE 40 MCG/ML (10ML) SYRINGE FOR IV PUSH (FOR BLOOD PRESSURE SUPPORT)
PREFILLED_SYRINGE | INTRAVENOUS | Status: AC
  Filled 2016-10-19: qty 10

## 2016-10-19 MED ORDER — PHENYLEPHRINE 40 MCG/ML (10ML) SYRINGE FOR IV PUSH (FOR BLOOD PRESSURE SUPPORT)
PREFILLED_SYRINGE | INTRAVENOUS | Status: DC | PRN
  Administered 2016-10-19: 120 ug via INTRAVENOUS
  Administered 2016-10-19: 160 ug via INTRAVENOUS
  Administered 2016-10-19: 120 ug via INTRAVENOUS

## 2016-10-19 MED ORDER — HYDROMORPHONE HCL 1 MG/ML IJ SOLN: 0.2500 mg | INTRAMUSCULAR | Status: DC | PRN

## 2016-10-19 MED ORDER — CALCIUM CARBONATE 1250 (500 CA) MG PO TABS
1250.0000 mg | ORAL_TABLET | Freq: Every day | ORAL | Status: DC
  Administered 2016-10-20: 1250 mg via ORAL
  Filled 2016-10-19: qty 1

## 2016-10-19 MED ORDER — DEXAMETHASONE SODIUM PHOSPHATE 10 MG/ML IJ SOLN
INTRAMUSCULAR | Status: DC | PRN
  Administered 2016-10-19: 10 mg via INTRAVENOUS

## 2016-10-19 MED ORDER — METHOCARBAMOL 500 MG PO TABS
500.0000 mg | ORAL_TABLET | Freq: Four times a day (QID) | ORAL | Status: DC | PRN
  Administered 2016-10-20 (×2): 500 mg via ORAL
  Filled 2016-10-19 (×2): qty 1

## 2016-10-19 MED ORDER — IRBESARTAN 150 MG PO TABS
150.0000 mg | ORAL_TABLET | Freq: Every day | ORAL | Status: DC
  Administered 2016-10-19: 150 mg via ORAL
  Filled 2016-10-19 (×3): qty 1

## 2016-10-19 MED ORDER — SODIUM CHLORIDE 0.9 % IR SOLN
Status: DC | PRN
  Administered 2016-10-19: 1000 mL

## 2016-10-19 MED ORDER — ASPIRIN 81 MG PO CHEW
81.0000 mg | CHEWABLE_TABLET | Freq: Every day | ORAL | Status: DC
  Administered 2016-10-20: 81 mg via ORAL
  Filled 2016-10-19: qty 1

## 2016-10-19 MED ORDER — PHENYLEPHRINE 40 MCG/ML (10ML) SYRINGE FOR IV PUSH (FOR BLOOD PRESSURE SUPPORT)
PREFILLED_SYRINGE | INTRAVENOUS | Status: AC
  Filled 2016-10-19: qty 20

## 2016-10-19 MED ORDER — PROPOFOL 1000 MG/100ML IV EMUL
INTRAVENOUS | Status: AC
  Filled 2016-10-19: qty 200

## 2016-10-19 MED ORDER — METOCLOPRAMIDE HCL 5 MG/ML IJ SOLN: 5.0000 mg | Freq: Three times a day (TID) | INTRAMUSCULAR | Status: DC | PRN

## 2016-10-19 MED ORDER — OXYCODONE HCL 5 MG PO TABS
5.0000 mg | ORAL_TABLET | ORAL | Status: DC | PRN
  Administered 2016-10-20 (×5): 10 mg via ORAL
  Filled 2016-10-19 (×5): qty 2

## 2016-10-19 MED ORDER — DIPHENHYDRAMINE HCL 12.5 MG/5ML PO ELIX: 12.5000 mg | ORAL_SOLUTION | ORAL | Status: DC | PRN

## 2016-10-19 MED ORDER — ARTIFICIAL TEARS OP OINT
TOPICAL_OINTMENT | OPHTHALMIC | Status: AC
  Filled 2016-10-19: qty 3.5

## 2016-10-19 MED ORDER — ONDANSETRON HCL 4 MG/2ML IJ SOLN: 4.0000 mg | Freq: Four times a day (QID) | INTRAMUSCULAR | Status: DC | PRN

## 2016-10-19 MED ORDER — ZOLPIDEM TARTRATE 5 MG PO TABS: 5.0000 mg | ORAL_TABLET | Freq: Every evening | ORAL | Status: DC | PRN

## 2016-10-19 SURGICAL SUPPLY — 56 items
BIT DRILL TWIST 2.7 (BIT) ×1 IMPLANT
BIT DRILL TWIST 2.7MM (BIT) ×1
BLADE SAW SGTL MED 73X18.5 STR (BLADE) ×3 IMPLANT
BUR EGG ELITE 4.0 (BURR) ×1 IMPLANT
BUR EGG ELITE 4.0MM (BURR) ×1
CAPT SHLDR REVTOTAL 2 ×2 IMPLANT
CEMENT BONE DEPUY (Cement) ×2 IMPLANT
CLOSURE STERI-STRIP 1/2X4 (GAUZE/BANDAGES/DRESSINGS) ×1
CLSR STERI-STRIP ANTIMIC 1/2X4 (GAUZE/BANDAGES/DRESSINGS) ×2 IMPLANT
COVER SURGICAL LIGHT HANDLE (MISCELLANEOUS) ×3 IMPLANT
DRAPE ORTHO SPLIT 77X108 STRL (DRAPES) ×6
DRAPE PROXIMA HALF (DRAPES) ×3 IMPLANT
DRAPE SURG ORHT 6 SPLT 77X108 (DRAPES) ×2 IMPLANT
DRAPE U-SHAPE 47X51 STRL (DRAPES) ×3 IMPLANT
DRSG MEPILEX BORDER 4X12 (GAUZE/BANDAGES/DRESSINGS) ×2 IMPLANT
DRSG MEPILEX BORDER 4X8 (GAUZE/BANDAGES/DRESSINGS) ×1 IMPLANT
DURAPREP 26ML APPLICATOR (WOUND CARE) ×3 IMPLANT
ELECT REM PT RETURN 9FT ADLT (ELECTROSURGICAL) ×3
ELECTRODE REM PT RTRN 9FT ADLT (ELECTROSURGICAL) ×1 IMPLANT
GLOVE BIOGEL PI ORTHO PRO SZ8 (GLOVE) ×4
GLOVE ORTHO TXT STRL SZ7.5 (GLOVE) ×3 IMPLANT
GLOVE PI ORTHO PRO STRL SZ8 (GLOVE) ×2 IMPLANT
GLOVE SURG ORTHO 8.0 STRL STRW (GLOVE) ×3 IMPLANT
GOWN STRL REUS W/ TWL XL LVL3 (GOWN DISPOSABLE) ×1 IMPLANT
GOWN STRL REUS W/TWL 2XL LVL3 (GOWN DISPOSABLE) ×3 IMPLANT
GOWN STRL REUS W/TWL XL LVL3 (GOWN DISPOSABLE) ×3
HANDPIECE INTERPULSE COAX TIP (DISPOSABLE) ×3
HOOD PEEL AWAY FACE SHEILD DIS (HOOD) ×6 IMPLANT
KIT BASIN OR (CUSTOM PROCEDURE TRAY) ×3 IMPLANT
KIT ROOM TURNOVER OR (KITS) ×3 IMPLANT
MANIFOLD NEPTUNE II (INSTRUMENTS) ×3 IMPLANT
NS IRRIG 1000ML POUR BTL (IV SOLUTION) ×3 IMPLANT
PACK SHOULDER (CUSTOM PROCEDURE TRAY) ×3 IMPLANT
PAD ARMBOARD 7.5X6 YLW CONV (MISCELLANEOUS) ×6 IMPLANT
PIN THREADED REVERSE (PIN) ×2 IMPLANT
SET HNDPC FAN SPRY TIP SCT (DISPOSABLE) ×1 IMPLANT
SLING ARM IMMOBILIZER LRG (SOFTGOODS) IMPLANT
SLING ARM IMMOBILIZER MED (SOFTGOODS) IMPLANT
SMARTMIX MINI TOWER (MISCELLANEOUS) ×3
SUCTION FRAZIER HANDLE 10FR (MISCELLANEOUS) ×2
SUCTION TUBE FRAZIER 10FR DISP (MISCELLANEOUS) ×1 IMPLANT
SUPPORT WRAP ARM LG (MISCELLANEOUS) ×3 IMPLANT
SUT FIBERWIRE #2 38 REV NDL BL (SUTURE) ×15
SUT MNCRL AB 4-0 PS2 18 (SUTURE) IMPLANT
SUT VIC AB 0 CT1 27 (SUTURE) ×3
SUT VIC AB 0 CT1 27XBRD ANBCTR (SUTURE) ×1 IMPLANT
SUT VIC AB 2-0 CT1 27 (SUTURE) ×3
SUT VIC AB 2-0 CT1 TAPERPNT 27 (SUTURE) ×1 IMPLANT
SUT VIC AB 3-0 SH 8-18 (SUTURE) ×3 IMPLANT
SUTURE FIBERWR#2 38 REV NDL BL (SUTURE) ×6 IMPLANT
TOWEL OR 17X24 6PK STRL BLUE (TOWEL DISPOSABLE) ×1 IMPLANT
TOWEL OR 17X26 10 PK STRL BLUE (TOWEL DISPOSABLE) ×1 IMPLANT
TOWER SMARTMIX MINI (MISCELLANEOUS) IMPLANT
TUBE CONNECTING 12'X1/4 (SUCTIONS)
TUBE CONNECTING 12X1/4 (SUCTIONS) IMPLANT
YANKAUER SUCT BULB TIP NO VENT (SUCTIONS) ×1 IMPLANT

## 2016-10-19 NOTE — Transfer of Care (Signed)
Immediate Anesthesia Transfer of Care Note  Patient: Lauren Moon  Procedure(s) Performed: Procedure(s): TOTAL SHOULDER ARTHROPLASTY (Left)  Patient Location: PACU  Anesthesia Type:GA combined with regional for post-op pain  Level of Consciousness: lethargic  Airway & Oxygen Therapy: Patient Spontanous Breathing and Patient connected to face mask oxygen OPA inserted.   Post-op Assessment: Report given to RN and Post -op Vital signs reviewed and stable  Post vital signs: Reviewed and stable  Last Vitals:  Vitals:   10/19/16 0616 10/19/16 1100  BP: (!) 162/82   Pulse: (!) 53   Resp: 18   Temp: 36.7 C 36.1 C    Last Pain:  Vitals:   10/19/16 1100  TempSrc:   PainSc: Asleep         Complications: No apparent anesthesia complications

## 2016-10-19 NOTE — Anesthesia Procedure Notes (Signed)
Procedure Name: Intubation Date/Time: 10/19/2016 7:44 AM Performed by: Mervyn Gay Pre-anesthesia Checklist: Patient identified, Patient being monitored, Timeout performed, Emergency Drugs available and Suction available Patient Re-evaluated:Patient Re-evaluated prior to inductionOxygen Delivery Method: Circle System Utilized Preoxygenation: Pre-oxygenation with 100% oxygen Intubation Type: IV induction Ventilation: Mask ventilation without difficulty and Oral airway inserted - appropriate to patient size Laryngoscope Size: 3 and Mac Grade View: Grade I Tube type: Oral Tube size: 7.5 mm Number of attempts: 1 Airway Equipment and Method: Stylet Placement Confirmation: ETT inserted through vocal cords under direct vision,  positive ETCO2 and breath sounds checked- equal and bilateral Secured at: 20 cm Tube secured with: Tape Dental Injury: Teeth and Oropharynx as per pre-operative assessment

## 2016-10-19 NOTE — Anesthesia Procedure Notes (Signed)
Anesthesia Regional Block: Interscalene brachial plexus block   Pre-Anesthetic Checklist: ,, timeout performed, Correct Patient, Correct Site, Correct Laterality, Correct Procedure,, site marked, risks and benefits discussed, Surgical consent,  Pre-op evaluation,  At surgeon's request and post-op pain management  Laterality: Left  Prep: chloraprep       Needles:  Injection technique: Single-shot  Needle Type: Echogenic Stimulator Needle     Needle Length: 5cm  Needle Gauge: 22     Additional Needles:   Procedures: ultrasound guided, nerve stimulator,,,,,,  Narrative:  Start time: 10/19/2016 7:06 AM End time: 10/19/2016 7:16 AM Injection made incrementally with aspirations every 5 mL.  Performed by: Personally   Additional Notes: Functioning IV was confirmed and monitors applied.  A 74mm 22ga echogenic arrow stimulator was used. Sterile prep and drape,hand hygiene and sterile gloves were used.Ultrasound guidance: relevent anatomy identified, needle position confirmed, local anesthetic spread visualized around nerve(s)., vascular puncture avoided.  Image printed for medical record.  Negative aspiration and negative test dose prior to incremental administration of local anesthetic. The patient tolerated the procedure well.

## 2016-10-19 NOTE — Anesthesia Postprocedure Evaluation (Addendum)
Anesthesia Post Note  Patient: Lauren Moon  Procedure(s) Performed: Procedure(s) (LRB): TOTAL SHOULDER ARTHROPLASTY (Left)  Patient location during evaluation: PACU Anesthesia Type: General Level of consciousness: sedated Pain management: pain level controlled Vital Signs Assessment: post-procedure vital signs reviewed and stable Respiratory status: spontaneous breathing and respiratory function stable Cardiovascular status: stable Anesthetic complications: no       Last Vitals:  Vitals:   10/19/16 1100 10/19/16 1130  BP: 133/76 (!) 170/81  Pulse: (!) 56 (!) 48  Resp: 16 15  Temp: 36.1 C     Last Pain:  Vitals:   10/19/16 1100  TempSrc:   PainSc: Asleep                 Barbera Perritt DANIEL

## 2016-10-19 NOTE — Discharge Instructions (Signed)

## 2016-10-19 NOTE — H&P (Signed)
PREOPERATIVE H&P  Chief Complaint: OA LEFT SHOULDER  HPI: Lauren Moon is a 71 y.o. female who presents for preoperative history and physical with a diagnosis of OA LEFT SHOULDER. Symptoms are rated as moderate to severe, and have been worsening.  This is significantly impairing activities of daily living.  She has elected for surgical management.   She has failed injections, activity modification, anti-inflammatories, and assistive devices as well as arthroscopic surgery.  Preoperative X-rays demonstrate end stage degenerative changes with osteophyte formation, loss of joint space, subchondral sclerosis. Pain 10/10.    Past Medical History:  Diagnosis Date  . Acute venous embolism and thrombosis of unspecified deep vessels of lower extremity   . Arthritis   . Heart murmur   . Hypertension   . Lumbago    Past Surgical History:  Procedure Laterality Date  . ABDOMINAL HYSTERECTOMY     ovaries removed as well  . CATARACT EXTRACTION W/ INTRAOCULAR LENS IMPLANT Bilateral    Done at [facility]  . JOINT REPLACEMENT Bilateral    knee, 1997, 2011  . SHOULDER ARTHROSCOPY     "cleaned my left shoulder out"    Social History   Social History  . Marital status: Widowed    Spouse name: N/A  . Number of children: N/A  . Years of education: N/A   Social History Main Topics  . Smoking status: Never Smoker  . Smokeless tobacco: Never Used  . Alcohol use No  . Drug use: No  . Sexual activity: Not Asked   Other Topics Concern  . None   Social History Narrative  . None   Family History  Problem Relation Age of Onset  . Heart disease Mother     before age 8  . Hypertension Mother   . Varicose Veins Mother   . Cancer Sister   . Diabetes Sister   . Heart disease Sister     before age 45  . Hypertension Sister   . Peripheral vascular disease Sister   . Bleeding Disorder Sister   . Cancer Brother   . Heart disease Brother     before age 51  . Hypertension Brother     Allergies  Allergen Reactions  . Penicillins   . Penicillin G Rash   Prior to Admission medications   Medication Sig Start Date End Date Taking? Authorizing Provider  amLODipine-valsartan (EXFORGE) 5-160 MG per tablet Take 1 tablet by mouth daily.  10/14/14  Yes Historical Provider, MD  aspirin 81 MG tablet Take 81 mg by mouth daily.   Yes Historical Provider, MD  Calcium Carbonate (CALTRATE 600 PO) Take 2 tablets by mouth daily. CHEWABLES   Yes Historical Provider, MD  Coenzyme Q10-Fish Oil-Vit E (CO-Q 10 OMEGA-3 FISH OIL) CAPS Take 2 capsules by mouth daily.   Yes Historical Provider, MD  diclofenac (VOLTAREN) 75 MG EC tablet Take 75 mg by mouth 2 (two) times daily as needed for moderate pain.    Yes Historical Provider, MD  Multiple Vitamins-Minerals (MULTIVITAMIN WITH MINERALS) tablet Take 1 tablet by mouth daily.   Yes Historical Provider, MD  traMADol (ULTRAM) 50 MG tablet Take 50 mg by mouth every 12 (twelve) hours as needed for moderate pain.   Yes Historical Provider, MD     Positive ROS: All other systems have been reviewed and were otherwise negative with the exception of those mentioned in the HPI and as above.  Physical Exam: General: Alert, no acute distress Cardiovascular: No pedal edema Respiratory:  No cyanosis, no use of accessory musculature GI: No organomegaly, abdomen is soft and non-tender Skin: No lesions in the area of chief complaint Neurologic: Sensation intact distally Psychiatric: Patient is competent for consent with normal mood and affect Lymphatic: No axillary or cervical lymphadenopathy  MUSCULOSKELETAL: left rom 0-90, ER 0 painful arc with crepitance.  Assessment: OA LEFT SHOULDER   Plan: Plan for Procedure(s): TOTAL SHOULDER ARTHROPLASTY  The risks benefits and alternatives were discussed with the patient including but not limited to the risks of nonoperative treatment, versus surgical intervention including infection, bleeding, nerve injury,   blood clots, cardiopulmonary complications, morbidity, mortality, among others, and they were willing to proceed.   Johnny Bridge, MD Cell [phone]   10/19/2016 7:17 AM

## 2016-10-19 NOTE — Op Note (Signed)
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  ANESTHESIA:   General with interscalene block  ESTIMATED BLOOD LOSS: 175 mL  PREOPERATIVE INDICATIONS:  Lauren Moon is a  71 y.o. female who has had previous rotator cuff repair surgery that failed and had progressive advanced glenohumeral rotator cuff arthropathy with severe medialization of the humeral head, loss of the glenoid down to the level of the coracoid, and acetabular reservation of the undersurface of the acromion with posterior fracture of the glenoid and fragmentation.  The risks benefits and alternatives were discussed with the patient preoperatively including but not limited to the risks of infection, bleeding, nerve injury, cardiopulmonary complications, the need for revision surgery, dislocation, brachial plexus palsy, incomplete relief of pain, among others, and the patient was willing to proceed. We also discussed the risks for failure of the prosthesis, the need for revision surgery, among others.  Operative specimens: I sent gelatinous joint fluid prior to incision through the capsule, which appeared to be normal, and somewhat thickened, but no gross evidence for infection. This was sent for Gram stain culture and sensitivity and evaluation for Propionibacterium acnes, requesting a 3 week duration of growth.  OPERATIVE IMPLANTS: Biomet size 10 micro- humeral stem press-fit with a 44 mm reverse shoulder arthroplasty tray with a standard liner and a 36 mm  glenosphere with a mini baseplate and 4 locking screws and one central nonlocking screw, and I used the humeral head flipped around to provide additional lateralization and bone graft for the glenoid sphere.  OPERATIVE FINDINGS: The subscapularis was in fairly poor condition, the biceps tendon not present, the humeral head was high riding, and had substantial abnormal osteophyte formation across the greater tuberosity as well as lesser tuberosity. There was fairly robust bursal hypertrophy anteriorly and superiorly which was excised. The joint fluid looked reasonably normal. The glenoid had extreme wear, although the fracture line on the CAT scan was not really visible clinically, it looked more like it was a wear pattern to the point that she was almost eroding through the glenoid itself.  There was also a fairly large subdeltoid fragment of bone adjacent to the acromion, which was clicking and popping with internal and external rotation after the trial implants were in. I was able to dissect this out and extricate this, which restored the rotation without any type of bony clicking or popping.  OPERATIVE PROCEDURE: The patient was brought to the operating room and placed in the supine position. General anesthesia was administered. IV antibiotics were given. Time out was performed. The upper extremity was prepped and draped in usual sterile fashion. The patient was in a beachchair position. Deltopectoral approach was carried out. The biceps was not present, I released the subscapularis off the bone, removed the subacromial and subdeltoid bursal tissue and also took cultures prior to incising the capsule.  I did this with an 18-gauge needle.  I then performed circumferential releases of the humerus, and then dislocated the head, and then reamed with the reamer to the above named size. Prior to making the cut, I  used the bur to smoothen the surface of the humeral head, remove any prominent bone islands of  remaining cartilage, and prepare this for grafting.  I then applied the jig, and cut the humeral head in 30 of retroversion, and then turned my attention to the glenoid.  Deep retractors were placed, there really wasn't any remaining labrum, and I used the bur to prepare the face of the glenoid.    I then went to the back table, and cut a small wafer off of the humeral head to minimize the bulk, and trimmed the size to match slightly larger than the actual baseplate, and then applied the head to the face of the glenoid. In the upright configuration, I did not feel like I was correcting the posterior bone loss adequately, so actually turned the head upside down which had a better shape for the correction that was necessary. It was basically a concentric sphere anyhow.  After confirming that I liked the inherent stability of the head on the glenoid, I placed a guidepin into the center position on the graft sitting on top of the glenoid, with slight inferior inclination. I then reamed over the guidepin, and this created a small metaphyseal cancellus blush inferiorly, I did have appropriate depth inside the vault, of native glenoid, that I could visualize through the graft. The base plate was selected and impacted place, and then I secured it centrally with a nonlocking screw, and I had excellent purchase superiorly, the inferior aspect had a size 15 mm screw, and was not overly impressive from the length standpoint, but the entire construct was holding. I placed a short locking screws on anterior and posterior aspects. The posterior screw in fact was larger, measuring about 25 mm. Again, I corrected the advanced posterior loss, so probably 10 mm of that was graft.  I then turned my attention to the glenosphere, and impacted this into place, placing slight inferior offset (set on B).   The glenoid sphere was completely seated, and had engagement of the [facility] taper. I then turned my attention back to the  humerus.  I sequentially broached, and then trialed, and was found to restore soft tissue tension, and it had 2 to 3 finger tightness. There was an abnormal clicking with internal rotation, I removed some of the prominent osteophytes off of the greater tuberosity, but the clicking was still present. I explored subacromial space, and on the lateral aspect of the subdeltoid fascia, lateral to the acromion, and distal to the acromion, there was a significant heterotopic ossification. This was removed with a curet, and then a rongeur and a Coker. There were 3 pieces in all, and after I removed all of these, trialing did not demonstrate any persistent clicking.   The above named components were selected. The shoulder felt stable throughout functional motion.  Before I placed the real prosthesis I had also placed a total of 3 #2 FiberWire through drill holes in the humerus for later subscapularis repair.  I then impacted the real prosthesis into place, as well as the real humeral tray, and reduced the shoulder. The shoulder had excellent motion, and was stable, and I irrigated the wounds copiously.    I then used these to repair the subscapularis. This came down to bone.  I then irrigated the shoulder copiously once more, repaired the deltopectoral interval with Vicryl followed by subcutaneous Vicryl with Steri-Strips and sterile gauze for the skin. The patient was awakened and returned back in stable and satisfactory  condition. There no complications and She tolerated the procedure well.

## 2016-10-20 ENCOUNTER — Encounter (HOSPITAL_COMMUNITY): Payer: Self-pay | Admitting: Orthopedic Surgery

## 2016-10-20 LAB — BASIC METABOLIC PANEL
Anion gap: 11 (ref 5–15)
BUN: 17 mg/dL (ref 6–20)
CO2: 25 mmol/L (ref 22–32)
Calcium: 8.5 mg/dL — ABNORMAL LOW (ref 8.9–10.3)
Chloride: 101 mmol/L (ref 101–111)
Creatinine, Ser: 0.91 mg/dL (ref 0.44–1.00)
GFR calc Af Amer: >60 mL/min (ref >60)
GLUCOSE: 120 mg/dL — AB (ref 65–99)
Potassium: 4.1 mmol/L (ref 3.5–5.1)
Sodium: 137 mmol/L (ref 135–145)

## 2016-10-20 LAB — CBC
HEMATOCRIT: 35.8 % — AB (ref 36.0–46.0)
HEMOGLOBIN: 11.7 g/dL — AB (ref 12.0–15.0)
MCH: 30 pg (ref 26.0–34.0)
MCHC: 32.7 g/dL (ref 30.0–36.0)
MCV: 91.8 fL (ref 78.0–100.0)
PLATELETS: 167 10*3/uL (ref 150–400)
RBC: 3.9 MIL/uL (ref 3.87–5.11)
RDW: 14.6 % (ref 11.5–15.5)
WBC: 16.6 10*3/uL — AB (ref 4.0–10.5)

## 2016-10-20 NOTE — Progress Notes (Signed)
Pt ready for d/c home today per MD. Pt met OT goals, has no home health needs. Discharge instructions and prescriptions reviewed with pt and son, all questions answered.  [place], [name]

## 2016-10-20 NOTE — Progress Notes (Signed)
Patient ID: Lauren Moon, female   DOB: 1945-12-29, 71 y.o.   MRN: [id number]     Subjective:  Patient reports pain as mild.  She is sitting up in the chair and in no acute distress.  Objective:   VITALS:   Vitals:   10/19/16 1515 10/19/16 2100 10/19/16 2300 10/20/16 0601  BP: 136/80 116/75 (!) 143/74 103/61  Pulse:   (!) 52 (!) 54  Resp:   18 18  Temp: 97.3 F (36.3 C) 97.7 F (36.5 C) 97.9 F (36.6 C) 98.2 F (36.8 C)  TempSrc:   Oral Oral  SpO2:  95% 96% 96%    ABD soft Sensation intact distally Dorsiflexion/Plantar flexion intact Incision: dressing C/D/I and scant drainage Good hand and wrist motion  Lab Results  Component Value Date   WBC 16.6 (H) 10/20/2016   HGB 11.7 (L) 10/20/2016   HCT 35.8 (L) 10/20/2016   MCV 91.8 10/20/2016   PLT 167 10/20/2016   BMET    Component Value Date/Time   NA 137 10/20/2016 0539   K 4.1 10/20/2016 0539   CL 101 10/20/2016 0539   CO2 25 10/20/2016 0539   GLUCOSE 120 (H) 10/20/2016 0539   BUN 17 10/20/2016 0539   CREATININE 0.91 10/20/2016 0539   CALCIUM 8.5 (L) 10/20/2016 0539   GFRNONAA >60 10/20/2016 0539   GFRAA >60 10/20/2016 0539     Assessment/Plan: 1 Day Post-Op   Principal Problem:   Left rotator cuff tear arthropathy Active Problems:   S/P shoulder replacement   Advance diet Up with therapy Discharge home with home health after PT/OT Dry dressing PRN Sling at all times   Remonia Richter 10/20/2016, 8:38 AM  Seen and agree with above.   Marchia Bond, MD Cell [phone]

## 2016-10-20 NOTE — Evaluation (Signed)
Occupational Therapy Evaluation Patient Details Name: Lauren Moon MRN: [id number] DOB: 1946/03/11 Today's Date: 10/20/2016    History of Present Illness 71 yo female s/p L reverse TSA   Clinical Impression   Patient evaluated by Occupational Therapy with no further acute OT needs identified. All education has been completed and the patient has no further questions. See below for any follow-up Occupational Therapy or equipment needs. OT to sign off. Thank you for referral.   Pt provided shoulder handout and reviewed in detail with patient with complete adl completed to demonstrate    Follow Up Recommendations  No OT follow up    Equipment Recommendations  None recommended by OT    Recommendations for Other Services       Precautions / Restrictions Precautions Precautions: Shoulder Type of Shoulder Precautions: conservative Shoulder Interventions: At all times Precaution Comments: shoulder handout for self care provided and reviewed in detail Required Braces or Orthoses: Sling Restrictions Weight Bearing Restrictions: Yes LUE Weight Bearing: Non weight bearing      Mobility Bed Mobility               General bed mobility comments: in chair on arrival and has recliner at home  Transfers Overall transfer level: Needs assistance   Transfers: Sit to/from Stand Sit to Stand: Min guard         General transfer comment: pt holding therapist hand for support    Balance                                            ADL Overall ADL's : Needs assistance/impaired Eating/Feeding: Modified independent   Grooming: Wash/dry hands;Wash/dry face;Supervision/safety;Sitting   Upper Body Bathing: Moderate assistance   Lower Body Bathing: Moderate assistance   Upper Body Dressing : Moderate assistance       Toilet Transfer: Minimal assistance           Functional mobility during ADLs: Minimal assistance General ADL Comments: pt completed  full adl at sink level. pt required (A) to complete R side of body bathing. Pt will have family and friend (A) at home  Pt educated on bathing and avoid washing directly on incision. Pt educated to use new wash cloth and towel each day. Pt educated to allow water to run across dressing and not to soak in a tub at this time.    Vision   Vision Assessment?: No apparent visual deficits     Perception     Praxis      Pertinent Vitals/Pain Pain Assessment: 0-10 Pain Score: 5  Pain Location: shoulder Pain Descriptors / Indicators: Grimacing;Discomfort;Sore Pain Intervention(s): Limited activity within patient's tolerance;Monitored during session;Premedicated before session;Repositioned;Ice applied     Hand Dominance Right   Extremity/Trunk Assessment Upper Extremity Assessment Upper Extremity Assessment: LUE deficits/detail LUE Deficits / Details: s/p surg   Lower Extremity Assessment Lower Extremity Assessment: Overall WFL for tasks assessed   Cervical / Trunk Assessment Cervical / Trunk Assessment: Normal   Communication Communication Communication: No difficulties   Cognition Arousal/Alertness: Awake/alert Behavior During Therapy: WFL for tasks assessed/performed Overall Cognitive Status: Within Functional Limits for tasks assessed                     General Comments       Exercises Exercises: Shoulder     Shoulder Instructions Shoulder Instructions Donning/doffing shirt without  moving shoulder: Minimal assistance Method for sponge bathing under operated UE: Moderate assistance Donning/doffing sling/immobilizer: Minimal assistance Correct positioning of sling/immobilizer: Minimal assistance ROM for elbow, wrist and digits of operated UE: Modified independent Positioning of UE while sleeping: Minimal assistance    Home Living Family/patient expects to be discharged to:: Private residence Living Arrangements: Alone Available Help at Discharge:  Family;Available PRN/intermittently Type of Home: House       Home Layout: One level     Bathroom Shower/Tub: Occupational psychologist: Standard     Home Equipment: None          Prior Functioning/Environment Level of Independence: Independent        Comments: will have son best frien d and daughter in law        OT Problem List:        OT Treatment/Interventions:      OT Goals(Current goals can be found in the care plan section)    OT Frequency:     Barriers to D/C:            Co-evaluation              End of Session Equipment Utilized During Treatment: Other (comment) (sling) Nurse Communication: Mobility status;Precautions  Activity Tolerance: Patient tolerated treatment well Patient left: in chair;with call bell/phone within reach  OT Visit Diagnosis: Unsteadiness on feet (R26.81)                ADL either performed or assessed with clinical judgement  Time: 9563-8756 OT Time Calculation (min): 18 min Charges:  OT General Charges $OT Visit: 1 Procedure OT Evaluation $OT Eval Moderate Complexity: 1 Procedure G-Codes:      Jeri Modena   OTR/L Pager: [phone] Office: [phone] .   Parke Poisson B 10/20/2016, 1:58 PM

## 2016-10-20 NOTE — Discharge Summary (Signed)
Physician Discharge Summary  Patient ID: Lauren Moon MRN: [id number] DOB/AGE: March 01, 1946 71 y.o.  Admit date: 10/19/2016 Discharge date: 10/20/2016  Admission Diagnoses:  Left rotator cuff tear arthropathy  Discharge Diagnoses:  Principal Problem:   Left rotator cuff tear arthropathy Active Problems:   S/P shoulder replacement   Past Medical History:  Diagnosis Date  . Acute venous embolism and thrombosis of unspecified deep vessels of lower extremity   . Arthritis   . Heart murmur   . Hypertension   . Left rotator cuff tear arthropathy 10/19/2016  . Lumbago     Surgeries: Procedure(s): TOTAL SHOULDER ARTHROPLASTY on 10/19/2016   Consultants (if any):   Discharged Condition: Improved  Hospital Course: Lauren Moon is an 71 y.o. female who was admitted 10/19/2016 with a diagnosis of Left rotator cuff tear arthropathy and went to the operating room on 10/19/2016 and underwent the above named procedures.    She was given perioperative antibiotics:  Anti-infectives    Start     Dose/Rate Route Frequency Ordered Stop   10/19/16 1600  ceFAZolin (ANCEF) IVPB 1 g/50 mL premix     1 g 100 mL/hr over 30 Minutes Intravenous Every 6 hours 10/19/16 1540 10/20/16 0331   10/19/16 0600  ceFAZolin (ANCEF) 2-4 GM/100ML-% IVPB    Comments:  Ardine Eng   : cabinet override      10/19/16 0600 10/19/16 1710   10/19/16 0554  ceFAZolin (ANCEF) IVPB 2g/100 mL premix  Status:  Discontinued     2 g 200 mL/hr over 30 Minutes Intravenous On call to O.R. 10/19/16 9323 10/19/16 1537    .  She was given sequential compression devices, early ambulation,  for DVT prophylaxis.  She benefited maximally from the hospital stay and there were no complications.    Recent vital signs:  Vitals:   10/19/16 2300 10/20/16 0601  BP: (!) 143/74 103/61  Pulse: (!) 52 (!) 54  Resp: 18 18  Temp: 97.9 F (36.6 C) 98.2 F (36.8 C)    Recent laboratory studies:  Lab Results  Component Value  Date   HGB 11.7 (L) 10/20/2016   HGB 12.7 10/08/2016   HGB 9.2 (L) 06/01/2010   Lab Results  Component Value Date   WBC 16.6 (H) 10/20/2016   PLT 167 10/20/2016   Lab Results  Component Value Date   INR 1.08 05/28/2010   Lab Results  Component Value Date   NA 137 10/20/2016   K 4.1 10/20/2016   CL 101 10/20/2016   CO2 25 10/20/2016   BUN 17 10/20/2016   CREATININE 0.91 10/20/2016   GLUCOSE 120 (H) 10/20/2016    Discharge Medications:   Allergies as of 10/20/2016      Reactions   Penicillins    Penicillin G Rash      Medication List    STOP taking these medications   diclofenac 75 MG EC tablet Commonly known as:  VOLTAREN     TAKE these medications   amLODipine-valsartan 5-160 MG tablet Commonly known as:  EXFORGE Take 1 tablet by mouth daily.   aspirin 81 MG tablet Take 81 mg by mouth daily.   baclofen 10 MG tablet Commonly known as:  LIORESAL Take 1 tablet (10 mg total) by mouth 3 (three) times daily. As needed for muscle spasm   CALTRATE 600 PO Take 2 tablets by mouth daily. CHEWABLES   CO-Q 10 Omega-3 Fish Oil Caps Take 2 capsules by mouth daily.   multivitamin with minerals  tablet Take 1 tablet by mouth daily.   ondansetron 4 MG tablet Commonly known as:  ZOFRAN Take 1 tablet (4 mg total) by mouth every 8 (eight) hours as needed for nausea or vomiting.   oxyCODONE 5 MG immediate release tablet Commonly known as:  ROXICODONE Take 1-2 tablets (5-10 mg total) by mouth every 4 (four) hours as needed for severe pain.   sennosides-docusate sodium 8.6-50 MG tablet Commonly known as:  SENOKOT-S Take 2 tablets by mouth daily.   traMADol 50 MG tablet Commonly known as:  ULTRAM Take 50 mg by mouth every 12 (twelve) hours as needed for moderate pain.       Diagnostic Studies: Ct Shoulder Left Wo Contrast  Result Date: 10/20/2016 CLINICAL DATA:  Postop day 1 status post left reverse total shoulder replacement with bone grafting of the glenoid  using the humeral head. EXAM: CT OF THE UPPER LEFT EXTREMITY WITHOUT CONTRAST TECHNIQUE: Multidetector CT imaging of the upper left extremity was performed according to the standard protocol. COMPARISON:  10/19/2016 radiographs and CT scan from 07/01/2016 FINDINGS: Bones/Joint/Cartilage Left reverse total shoulder replacement in place. With regard to the glenoid component, we show the original expanded and widened bony glenoid to be supplemented by a 3.7 by 1.4 by 4.1 cm crescentic bony structure likely originally donated from the humeral head given the operative history. The ball component of the joint is mounted with 4 screws extending through this interposed fragment and into the widened glenoid. The larger central screw captures the anterior cortex of the glenoid neck in the vicinity of the subscapularis on image 38/4. The most caudad screw captures about 6 mm at the original glenoid but has questionable surrounding lucency on image 68/6. The glenoid components appear well positioned. The proximal humeral socket prosthesis component is observed without a well-defined unexpected fracture in the adjacent proximal humerus. Small amount of gas density along the distal margin of the stem in the medullary space, likely postoperative. There is expected alignment between the humeral and glenoid components of the prosthesis. Os acromiale is again observed. Scalloped appearance of the os acromiale, distal clavicle, and acromion compatible with acromioplasty and likely distal clavicular partial resection. Fluid density below the distal clavicle and acromion as on image 55/9 Ligaments Suboptimally assessed by CT. Muscles and Tendons Chronically atrophic supraspinatus and infraspinatus muscles. Soft tissues As expected following this surgery, there is gas density tracking in the subcutaneous tissues, along the deltoid, and along the pectoralis. Gas density also tracks down along the biceps. Mild cardiomegaly. Mild  atelectasis in the left upper lobe. Atherosclerotic calcification of the aortic arch. IMPRESSION: 1. Left traversed total shoulder replacement in place, with crescentic bony structure interposed between the glenoid component in the original flattened glenoid compatible with bony augmentation, and glenoid screw positioning as detailed above. 2. No visible fracture or complicating feature along the humeral component of the prosthesis. 3. Scalloped appearance of the os acromiale, distal clavicle, and acromion compatible with acromioplasty. 4. Scattered gas tracking along regional musculature, not unexpected on postop day 1. 5. Chronically atrophic supraspinatus and infraspinatus muscles. Electronically Signed   By: Van Clines M.D.   On: 10/20/2016 09:29   Dg Shoulder Left Port  Result Date: 10/19/2016 CLINICAL DATA:  Post shoulder replacement EXAM: LEFT SHOULDER - 1 VIEW COMPARISON:  None. FINDINGS: Changes of left shoulder replacement. No hardware bony complicating feature. IMPRESSION: Left shoulder replacement.  No visible complicating feature. Electronically Signed   By: Rolm Baptise M.D.   On:  10/19/2016 11:54    Disposition:     Follow-up Information    Reyes Fifield P, MD. Schedule an appointment as soon as possible for a visit in 2 weeks.   Specialty:  Orthopedic Surgery Contact information: [address] [place] [address] [phone]            Signed: Johnny Bridge 10/20/2016, 12:56 PM

## 2016-10-20 NOTE — Progress Notes (Signed)
PT Cancellation Note  Patient Details Name: MATILDE POTTENGER MRN: [id number] DOB: [birth year]   Cancelled Treatment:    Reason Eval/Treat Not Completed: OT screened, no needs identified, will sign off OT screened and communicated no PT needs at this time. Will sign off. Please reconsult if needs change.    Army Melia 10/20/2016, 1:48 PM  Nicky Pugh, PT, DPT  Acute Rehabilitation Services  Pager: [phone]

## 2016-10-22 LAB — BODY FLUID CULTURE: CULTURE: NO GROWTH

## 2016-11-10 DIAGNOSIS — M79642 Pain in left hand: Secondary | ICD-10-CM | POA: Insufficient documentation

## 2016-12-07 ENCOUNTER — Other Ambulatory Visit: Payer: Self-pay | Admitting: Anesthesiology

## 2016-12-07 DIAGNOSIS — G90512 Complex regional pain syndrome I of left upper limb: Secondary | ICD-10-CM

## 2016-12-13 ENCOUNTER — Ambulatory Visit
Admission: RE | Admit: 2016-12-13 | Discharge: 2016-12-13 | Disposition: A | Discharge status: Home / Self Care | Payer: BLUE CROSS/BLUE SHIELD | Source: Ambulatory Visit | Attending: Anesthesiology | Admitting: Anesthesiology

## 2016-12-13 DIAGNOSIS — G90512 Complex regional pain syndrome I of left upper limb: Secondary | ICD-10-CM

## 2016-12-15 DIAGNOSIS — M25522 Pain in left elbow: Secondary | ICD-10-CM | POA: Insufficient documentation

## 2017-01-01 NOTE — Addendum Note (Signed)
Addendum  created 01/01/17 1111 by Duane Boston, MD   Sign clinical note

## 2017-01-27 DIAGNOSIS — Z124 Encounter for screening for malignant neoplasm of cervix: Secondary | ICD-10-CM | POA: Insufficient documentation

## 2017-02-08 DIAGNOSIS — J342 Deviated nasal septum: Secondary | ICD-10-CM | POA: Insufficient documentation

## 2017-02-08 DIAGNOSIS — R600 Localized edema: Secondary | ICD-10-CM

## 2017-02-08 DIAGNOSIS — J309 Allergic rhinitis, unspecified: Secondary | ICD-10-CM | POA: Insufficient documentation

## 2017-02-08 HISTORY — DX: Localized edema: R60.0

## 2017-05-27 ENCOUNTER — Ambulatory Visit (INDEPENDENT_AMBULATORY_CARE_PROVIDER_SITE_OTHER): Payer: Medicare Other

## 2017-05-27 ENCOUNTER — Ambulatory Visit (INDEPENDENT_AMBULATORY_CARE_PROVIDER_SITE_OTHER): Payer: Medicare Other | Admitting: Sports Medicine

## 2017-05-27 DIAGNOSIS — M7751 Other enthesopathy of right foot: Secondary | ICD-10-CM | POA: Diagnosis not present

## 2017-05-27 DIAGNOSIS — M779 Enthesopathy, unspecified: Secondary | ICD-10-CM

## 2017-05-27 DIAGNOSIS — M19079 Primary osteoarthritis, unspecified ankle and foot: Secondary | ICD-10-CM

## 2017-05-27 DIAGNOSIS — M21619 Bunion of unspecified foot: Secondary | ICD-10-CM

## 2017-05-27 DIAGNOSIS — M79671 Pain in right foot: Secondary | ICD-10-CM

## 2017-05-27 DIAGNOSIS — M2140 Flat foot [pes planus] (acquired), unspecified foot: Secondary | ICD-10-CM

## 2017-05-27 MED ORDER — TRIAMCINOLONE ACETONIDE 10 MG/ML IJ SUSP: 10.0000 mg | Freq: Once | INTRAMUSCULAR | Status: DC

## 2017-05-27 MED ORDER — DEXAMETHASONE SODIUM PHOSPHATE 120 MG/30ML IJ SOLN: 4.0000 mg | Freq: Once | INTRAMUSCULAR | Status: DC

## 2017-05-27 NOTE — Progress Notes (Signed)
Subjective: Lauren Moon is a 71 y.o. female patient who presents to office for evaluation of right foot pain. Patient complains of progressive pain especially over the last week in the right foot at the top of the foot. States that it hurts when she is attempting to walk or put pressure on the foot. States that the pain is a sharp pain. Also noted that she noticed a red area pop up on the foot. However, the red area is now gone, but the pain still there. States that she tried rubbing alcohol. Patient denies any other pedal complaints. Denies injury/trip/fall/sprain/any other causative factors.   Patient Active Problem List   Diagnosis Date Noted  . Left rotator cuff tear arthropathy 10/19/2016  . S/P shoulder replacement 10/19/2016  . Varicose veins of leg with complications 95/63/8756  . Varicose veins of lower extremities with other complications 43/32/9518  . Swelling of limb 12/31/2013    Current Outpatient Prescriptions on File Prior to Visit  Medication Sig Dispense Refill  . amLODipine-valsartan (EXFORGE) 5-160 MG per tablet Take 1 tablet by mouth daily.     Marland Kitchen aspirin 81 MG tablet Take 81 mg by mouth daily.    . baclofen (LIORESAL) 10 MG tablet Take 1 tablet (10 mg total) by mouth 3 (three) times daily. As needed for muscle spasm 50 tablet 0  . Calcium Carbonate (CALTRATE 600 PO) Take 2 tablets by mouth daily. CHEWABLES    . Coenzyme Q10-Fish Oil-Vit E (CO-Q 10 OMEGA-3 FISH OIL) CAPS Take 2 capsules by mouth daily.    . Multiple Vitamins-Minerals (MULTIVITAMIN WITH MINERALS) tablet Take 1 tablet by mouth daily.    . ondansetron (ZOFRAN) 4 MG tablet Take 1 tablet (4 mg total) by mouth every 8 (eight) hours as needed for nausea or vomiting. 30 tablet 0  . oxyCODONE (ROXICODONE) 5 MG immediate release tablet Take 1-2 tablets (5-10 mg total) by mouth every 4 (four) hours as needed for severe pain. 50 tablet 0  . sennosides-docusate sodium (SENOKOT-S) 8.6-50 MG tablet Take 2 tablets by  mouth daily. 30 tablet 1  . traMADol (ULTRAM) 50 MG tablet Take 50 mg by mouth every 12 (twelve) hours as needed for moderate pain.     No current facility-administered medications on file prior to visit.     Allergies  Allergen Reactions  . Penicillins   . Penicillin G Rash    Objective:  General: Well developed, nourished, no acute distress, awake, alert and oriented x 3  Vascular: Dorsalis pedis artery 2/4 bilateral, Posterior tibial artery 1/4 bilateral, skin temperature warm to warm proximal to distal bilateral lower extremities, mild varicosities, scant pedal hair present bilateral.  Neurological: Gross sensation present via light touch bilateral.   Dermatological: Skin is warm, dry, and supple bilateral, Nails 1-10 are short, thick, and discolored with moderate subungal debris, no webspace macerations present bilateral, no open lesions present bilateral, very minimal hyperkeratotic tissue present at medial aspect of left 2nd toe from toes rubbing that is much improved since use of silicone toe pad with no underlying opening or signs of infection bilateral.  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 Muscular strength within normal limits without pain. No pain with calf compression bilateral. Pes planus foot type bilateral.  X-rays right foot: Osseous mineralization within normal limits. There is significant  pes planus deformity with worse metatarsophalangeal joint space narrowing  andallux valgus with deviation of the first metatarsal, suggestive of bunion on right, there is significant midtarsal breach with generalized osteoarthritis. There are calcaneal spurs present. No fracture or dislocation. Soft tissues within normal limits  Assessment and Plan: Problem List Items  Addressed This Visit    None    Visit Diagnoses    Tendonitis    -  Primary   Relevant Medications   triamcinolone acetonide (KENALOG) 10 MG/ML injection 10 mg (Start on 05/27/2017  1:15 PM)   dexamethasone (DECADRON) injection 4 mg (Start on 05/27/2017  1:15 PM)   Other Relevant Orders   DG Foot Complete Right   Arthritis of foot       Relevant Medications   triamcinolone acetonide (KENALOG) 10 MG/ML injection 10 mg (Start on 05/27/2017  1:15 PM)   dexamethasone (DECADRON) injection 4 mg (Start on 05/27/2017  1:15 PM)   Right foot pain       Pes planus, unspecified laterality       Bunion          -Complete examination performed -Xrays reviewed -Discussed treatement options for likely tendonitis with arthritis  -After oral consent and aseptic prep, injected a mixture containing 1 ml of 2%  plain lidocaine, 1 ml 0.5% plain marcaine, 0.5 ml of kenalog 10 and 0.5 ml of dexamethasone phosphate into right dorsal midfoot without complication. Post-injection care discussed with patient.  -Recommend ice, rest, elevation, and Tylenol arthritis -Patient to return to office as needed or sooner if condition worsens.  Landis Martins, DPM

## 2017-08-18 DIAGNOSIS — M62838 Other muscle spasm: Secondary | ICD-10-CM | POA: Insufficient documentation

## 2017-08-31 ENCOUNTER — Encounter: Payer: Self-pay | Admitting: Sports Medicine

## 2017-08-31 ENCOUNTER — Ambulatory Visit (INDEPENDENT_AMBULATORY_CARE_PROVIDER_SITE_OTHER): Payer: BLUE CROSS/BLUE SHIELD | Admitting: Sports Medicine

## 2017-08-31 DIAGNOSIS — L84 Corns and callosities: Secondary | ICD-10-CM | POA: Diagnosis not present

## 2017-08-31 DIAGNOSIS — B351 Tinea unguium: Secondary | ICD-10-CM

## 2017-08-31 DIAGNOSIS — M79609 Pain in unspecified limb: Secondary | ICD-10-CM

## 2017-08-31 DIAGNOSIS — M79675 Pain in left toe(s): Secondary | ICD-10-CM

## 2017-08-31 NOTE — Patient Instructions (Signed)
Vinegar soaks 1 cup of white distilled vinegar to 8 cups of warm water.  Soak 20 mins. May repeat soak two times per week.  If there is thickness to nails may file nails after soaks or after bath/shower with nail file and apply tea tree oil. Apply oil daily to nails after filing for the best result.  

## 2017-08-31 NOTE — Progress Notes (Signed)
Patient ID: Lauren Moon, female   DOB: 1946/04/20, 72 y.o.   MRN: [id number]  Subjective: Lauren Moon is a 72 y.o. female patient seen today in office with complaint of painful corn at left second toe.  Patient reports that pain has increased in intensity and is moderate in nature by the end of her workday at [employer] has to immediately take off her shoe and wear open toe sandal.  Patient states that she has been applying Neosporin to the area that helps a little bit however the corn has become very painful and patient desires for this to be trim.  Patient also desires for her left first toe to be looked at to be trimmed since the nails are so hard and thick. Patient has no other pedal complaints at this time.   Patient Active Problem List   Diagnosis Date Noted  . Left rotator cuff tear arthropathy 10/19/2016  . S/P shoulder replacement 10/19/2016  . Varicose veins of leg with complications 76/19/5093  . Varicose veins of lower extremities with other complications 26/71/2458  . Swelling of limb 12/31/2013   Current Outpatient Medications on File Prior to Visit  Medication Sig Dispense Refill  . amLODipine-valsartan (EXFORGE) 5-160 MG per tablet Take 1 tablet by mouth daily.     Marland Kitchen aspirin 81 MG tablet Take 81 mg by mouth daily.    . baclofen (LIORESAL) 10 MG tablet Take 1 tablet (10 mg total) by mouth 3 (three) times daily. As needed for muscle spasm 50 tablet 0  . Calcium Carbonate (CALTRATE 600 PO) Take 2 tablets by mouth daily. CHEWABLES    . Coenzyme Q10-Fish Oil-Vit E (CO-Q 10 OMEGA-3 FISH OIL) CAPS Take 2 capsules by mouth daily.    . Multiple Vitamins-Minerals (MULTIVITAMIN WITH MINERALS) tablet Take 1 tablet by mouth daily.    . ondansetron (ZOFRAN) 4 MG tablet Take 1 tablet (4 mg total) by mouth every 8 (eight) hours as needed for nausea or vomiting. 30 tablet 0  . oxyCODONE (ROXICODONE) 5 MG immediate release tablet Take 1-2 tablets (5-10 mg total) by mouth every 4 (four) hours  as needed for severe pain. 50 tablet 0  . sennosides-docusate sodium (SENOKOT-S) 8.6-50 MG tablet Take 2 tablets by mouth daily. 30 tablet 1  . traMADol (ULTRAM) 50 MG tablet Take 50 mg by mouth every 12 (twelve) hours as needed for moderate pain.     Current Facility-Administered Medications on File Prior to Visit  Medication Dose Route Frequency Provider Last Rate Last Dose  . dexamethasone (DECADRON) injection 4 mg  4 mg Intra-articular Once Corinn Stoltzfus, DPM      . triamcinolone acetonide (KENALOG) 10 MG/ML injection 10 mg  10 mg Other Once Landis Martins, DPM       Allergies  Allergen Reactions  . Penicillins   . Penicillin G Rash    Past Surgical History:  Procedure Laterality Date  . ABDOMINAL HYSTERECTOMY     ovaries removed as well  . CATARACT EXTRACTION W/ INTRAOCULAR LENS IMPLANT Bilateral    Done at [facility]  . JOINT REPLACEMENT Bilateral    knee, 1997, 2011  . SHOULDER ARTHROSCOPY     "cleaned my left shoulder out"   . TOTAL SHOULDER ARTHROPLASTY Left 09/2016  . TOTAL SHOULDER ARTHROPLASTY Left 10/19/2016   Procedure: TOTAL SHOULDER ARTHROPLASTY;  Surgeon: Marchia Bond, MD;  Location: [place];  Service: Orthopedics;  Laterality: Left;   Family History  Problem Relation Age of Onset  . Heart  disease Mother        before age 8  . Hypertension Mother   . Varicose Veins Mother   . Cancer Sister   . Diabetes Sister   . Heart disease Sister        before age 65  . Hypertension Sister   . Peripheral vascular disease Sister   . Bleeding Disorder Sister   . Cancer Brother   . Heart disease Brother        before age 31  . Hypertension Brother    Social History   Socioeconomic History  . Marital status: Widowed    Spouse name: Not on file  . Number of children: Not on file  . Years of education: Not on file  . Highest education level: Not on file  Social Needs  . Financial resource strain: Not on file  . Food insecurity - worry: Not on file  . Food  insecurity - inability: Not on file  . Transportation needs - medical: Not on file  . Transportation needs - non-medical: Not on file  Occupational History  . Not on file  Tobacco Use  . Smoking status: Never Smoker  . Smokeless tobacco: Never Used  Substance and Sexual Activity  . Alcohol use: No    Alcohol/week: 0.0 oz  . Drug use: No  . Sexual activity: Not on file  Other Topics Concern  . Not on file  Social History Narrative  . Not on file   Works at [employer]  Objective: Physical Exam  General: Well developed, nourished, no acute distress, awake, alert and oriented x 3  Vascular: Dorsalis pedis artery 2/4 bilateral, Posterior tibial artery 1/4 bilateral, skin temperature warm to warm proximal to distal bilateral lower extremities, mild varicosities, scant pedal hair present bilateral.  Neurological: Gross sensation present via light touch bilateral.   Dermatological: Skin is warm, dry, and supple bilateral, Nails 1-10 are tender, long, thick, and discolored with moderate subungal debris, no webspace macerations present bilateral, no open lesions present bilateral, moderate hyperkeratotic tissue present at medial aspect of left 2nd toe from toes rubbing with no underlying opening or signs of infection bilateral.  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 Muscular strength within normal limits without pain. No pain with calf compression bilateral. Pes planus foot type bilateral.  Assessment and Plan:  Problem List Items Addressed This Visit    None    Visit Diagnoses    Corn of toe    -  Primary   Toe pain, left       Pain due to onychomycosis of nail         -Examined patient.  -Discussed treatment options for painful corn of toe and mycotic nails -Mechanically debrided using a sterile chisel blade the corn at the medial aspect of the  left second toe -Complementary mechanically debrided and reduced mycotic nails with sterile nail nipper and dremel nail file without incident. -Dispensed foam toe separator for left second toe -Recommend good supportive shoes daily for foot type -Recommend skin emollients for the corn and topical lidocaine if needed for pain -Patient to return as needed or sooner if symptoms worsen.   Landis Martins, DPM

## 2018-01-26 DIAGNOSIS — N951 Menopausal and female climacteric states: Secondary | ICD-10-CM | POA: Insufficient documentation

## 2018-05-11 DIAGNOSIS — M47812 Spondylosis without myelopathy or radiculopathy, cervical region: Secondary | ICD-10-CM | POA: Insufficient documentation

## 2018-05-11 DIAGNOSIS — M48061 Spinal stenosis, lumbar region without neurogenic claudication: Secondary | ICD-10-CM | POA: Insufficient documentation

## 2018-07-11 DIAGNOSIS — M79605 Pain in left leg: Secondary | ICD-10-CM | POA: Insufficient documentation

## 2018-09-06 DIAGNOSIS — R748 Abnormal levels of other serum enzymes: Secondary | ICD-10-CM | POA: Insufficient documentation

## 2019-02-08 DIAGNOSIS — M79661 Pain in right lower leg: Secondary | ICD-10-CM | POA: Insufficient documentation

## 2019-02-08 DIAGNOSIS — R600 Localized edema: Secondary | ICD-10-CM | POA: Insufficient documentation

## 2019-02-08 DIAGNOSIS — Z8249 Family history of ischemic heart disease and other diseases of the circulatory system: Secondary | ICD-10-CM | POA: Insufficient documentation

## 2019-02-26 DIAGNOSIS — I82439 Acute embolism and thrombosis of unspecified popliteal vein: Secondary | ICD-10-CM | POA: Insufficient documentation

## 2019-04-04 DIAGNOSIS — R079 Chest pain, unspecified: Secondary | ICD-10-CM | POA: Diagnosis not present

## 2019-04-04 DIAGNOSIS — I10 Essential (primary) hypertension: Secondary | ICD-10-CM | POA: Diagnosis not present

## 2019-04-05 DIAGNOSIS — E7800 Pure hypercholesterolemia, unspecified: Secondary | ICD-10-CM | POA: Diagnosis not present

## 2019-04-05 DIAGNOSIS — I10 Essential (primary) hypertension: Secondary | ICD-10-CM | POA: Diagnosis not present

## 2019-04-05 DIAGNOSIS — R079 Chest pain, unspecified: Secondary | ICD-10-CM | POA: Diagnosis not present

## 2019-04-05 DIAGNOSIS — E119 Type 2 diabetes mellitus without complications: Secondary | ICD-10-CM | POA: Diagnosis not present

## 2019-04-05 DIAGNOSIS — Z86718 Personal history of other venous thrombosis and embolism: Secondary | ICD-10-CM

## 2019-04-12 DIAGNOSIS — R9439 Abnormal result of other cardiovascular function study: Secondary | ICD-10-CM | POA: Insufficient documentation

## 2019-04-12 DIAGNOSIS — R0789 Other chest pain: Secondary | ICD-10-CM | POA: Insufficient documentation

## 2019-04-24 ENCOUNTER — Ambulatory Visit (INDEPENDENT_AMBULATORY_CARE_PROVIDER_SITE_OTHER): Payer: BC Managed Care – PPO | Admitting: Cardiology

## 2019-04-24 ENCOUNTER — Other Ambulatory Visit: Payer: Self-pay

## 2019-04-24 VITALS — BP 112/74 | HR 62 | Ht 60.0 in | Wt 164.0 lb

## 2019-04-24 DIAGNOSIS — R0789 Other chest pain: Secondary | ICD-10-CM

## 2019-04-24 DIAGNOSIS — E782 Mixed hyperlipidemia: Secondary | ICD-10-CM

## 2019-04-24 DIAGNOSIS — I82431 Acute embolism and thrombosis of right popliteal vein: Secondary | ICD-10-CM | POA: Insufficient documentation

## 2019-04-24 DIAGNOSIS — E669 Obesity, unspecified: Secondary | ICD-10-CM | POA: Diagnosis not present

## 2019-04-24 DIAGNOSIS — R9439 Abnormal result of other cardiovascular function study: Secondary | ICD-10-CM

## 2019-04-24 DIAGNOSIS — E66811 Obesity, class 1: Secondary | ICD-10-CM

## 2019-04-24 DIAGNOSIS — I10 Essential (primary) hypertension: Secondary | ICD-10-CM | POA: Diagnosis not present

## 2019-04-24 DIAGNOSIS — Z131 Encounter for screening for diabetes mellitus: Secondary | ICD-10-CM

## 2019-04-24 HISTORY — DX: Other chest pain: R07.89

## 2019-04-24 HISTORY — DX: Abnormal result of other cardiovascular function study: R94.39

## 2019-04-24 HISTORY — DX: Acute embolism and thrombosis of right popliteal vein: I82.431

## 2019-04-24 NOTE — Progress Notes (Signed)
Cardiology Office Note:    Date:  04/24/2019   ID:  Lauren Moon, Lauren Moon [birth year], MRN [id number]  PCP:  Algis Greenhouse, MD  Cardiologist:  Berniece Salines, DO  Electrophysiologist:  None   Referring MD: Algis Greenhouse, MD    The patient was referred by her primary care doctor after recent hospitalization.  History of Present Illness:    Lauren Moon is a 73 y.o. female with a hx of hypertension, hyperlipidemia, per hospital records type 2 diabetes presents for an initial visit to establish cardiac care after her recent hospitalization.  The patient reports that she presented to the hospital due to chest pain and was concerned that she was having a heart attack.  During her hospital stay she did undergo a pharmacologic nuclear stress test which was reported excellent abnormal for myocardial infarction in the anterior septal wall extending into her apex.    Since her hospitalization she reports she has been doing a lot better, she denies any chest pain, shortness of breath, nausea or vomiting.  In fact she says she has been back to work and she was at work prior to her visit today.   Past Medical History:  Diagnosis Date  . Abnormal nuclear stress test 04/24/2019  . Acute deep vein thrombosis (DVT) of popliteal vein of right lower extremity ([place]) 04/24/2019  . Acute venous embolism and thrombosis of unspecified deep vessels of lower extremity   . Arthritis   . Benign hypertension 11/05/2015   Overview:  1986  . Bilateral lower extremity edema 02/08/2017   Overview:  2018  . Chest pain, atypical 04/24/2019  . Heart murmur   . Hypertension   . Left rotator cuff tear arthropathy 10/19/2016  . Lumbago   . Mixed hyperlipidemia 11/05/2015  . Varicose veins of leg with complications [id number]  . Varicose veins of lower extremities with other complications [id number]  . Varicose veins of lower extremity 04/16/2014    Past Surgical History:  Procedure Laterality Date  . ABDOMINAL  HYSTERECTOMY     ovaries removed as well  . CATARACT EXTRACTION W/ INTRAOCULAR LENS IMPLANT Bilateral    Done at [facility]  . JOINT REPLACEMENT Bilateral    knee, 1997, 2011  . SHOULDER ARTHROSCOPY     "cleaned my left shoulder out"   . TOTAL SHOULDER ARTHROPLASTY Left 09/2016  . TOTAL SHOULDER ARTHROPLASTY Left 10/19/2016   Procedure: TOTAL SHOULDER ARTHROPLASTY;  Surgeon: Marchia Bond, MD;  Location: [facility];  Service: Orthopedics;  Laterality: Left;  . TUBAL LIGATION    . VARICOSE VEIN SURGERY      Current Medications: Current Meds  Medication Sig  . amLODipine (NORVASC) 5 MG tablet TAKE 1 TABLET BY MOUTH ONCE DAILY IN THE MORNING FOR HIGH BLOOD PRESSURE  . aspirin 81 MG tablet Take 81 mg by mouth daily.  Marland Kitchen atorvastatin (LIPITOR) 10 MG tablet Take 10 mg by mouth daily.  . Calcium Carbonate-Vitamin D (CALTRATE 600+D PO) Take 1 tablet by mouth 2 (two) times daily.  . Multiple Vitamins-Minerals (MULTIVITAMIN WITH MINERALS) tablet Take 1 tablet by mouth daily.  . valsartan-hydrochlorothiazide (DIOVAN-HCT) 320-12.5 MG tablet TAKE 1 TABLET BY MOUTH ONCE DAILY IN THE MORNING FOR BLOOD PRESSURE     Allergies:   Penicillins and Penicillin g   Social History   Socioeconomic History  . Marital status: Widowed    Spouse name: Not on file  . Number of children: Not on file  . Years of education: Not on  file  . Highest education level: Not on file  Occupational History  . Not on file  Social Needs  . Financial resource strain: Not on file  . Food insecurity    Worry: Not on file    Inability: Not on file  . Transportation needs    Medical: Not on file    Non-medical: Not on file  Tobacco Use  . Smoking status: Never Smoker  . Smokeless tobacco: Never Used  Substance and Sexual Activity  . Alcohol use: No    Alcohol/week: 0.0 standard drinks  . Drug use: No  . Sexual activity: Not on file  Lifestyle  . Physical activity    Days per week: Not on file    Minutes per  session: Not on file  . Stress: Not on file  Relationships  . Social Herbalist on phone: Not on file    Gets together: Not on file    Attends religious service: Not on file    Active member of club or organization: Not on file    Attends meetings of clubs or organizations: Not on file    Relationship status: Not on file  Other Topics Concern  . Not on file  Social History Narrative  . Not on file     Family History: The patient's family history includes Bleeding Disorder in her sister; Breast cancer in her sister; Cancer in her brother and sister; Diabetes in her sister; Heart disease in her brother, mother, and sister; Hypertension in her brother, mother, and sister; Peripheral vascular disease in her sister; Stroke in her brother; Varicose Veins in her mother.  ROS:   Review of Systems  Constitution: Negative for decreased appetite, fever and weight gain.  HENT: Negative for congestion, ear discharge, hoarse voice and sore throat.   Eyes: Negative for discharge, redness, vision loss in right eye and visual halos.  Cardiovascular: Negative for chest pain, dyspnea on exertion, leg swelling, orthopnea and palpitations.  Respiratory: Negative for cough, hemoptysis, shortness of breath and snoring.   Endocrine: Negative for heat intolerance and polyphagia.  Hematologic/Lymphatic: Negative for bleeding problem. Does not bruise/bleed easily.  Skin: Negative for flushing, nail changes, rash and suspicious lesions.  Musculoskeletal: Negative for arthritis, joint pain, muscle cramps, myalgias, neck pain and stiffness.  Gastrointestinal: Negative for abdominal pain, bowel incontinence, diarrhea and excessive appetite.  Genitourinary: Negative for decreased libido, genital sores and incomplete emptying.  Neurological: Negative for brief paralysis, focal weakness, headaches and loss of balance.  Psychiatric/Behavioral: Negative for altered mental status, depression and suicidal  ideas.  Allergic/Immunologic: Negative for HIV exposure and persistent infections.    EKGs/Labs/Other Studies Reviewed:    The following studies were reviewed today:  EKG:  The ekg ordered today demonstrates sinus rhythm, heart rate 62 bpm compared to EKG performed on April 04, 2019 the patient did have sinus bradycardia heart rate 59 bpm with sinus arrhythmia.  Pharmacologic nuclear stress test performed at [facility] reported abnormal for myocardial infarction in the anterior septal wall extending into her apex.    Recent Labs: No results found for requested labs within last 8760 hours.  Recent Lipid Panel No results found for: CHOL, TRIG, HDL, CHOLHDL, VLDL, LDLCALC, LDLDIRECT  Physical Exam:    VS:  BP 112/74 (BP Location: Right Arm, Patient Position: Sitting, Cuff Size: Normal)   Pulse 62   Ht 5' (1.524 m)   Wt 164 lb (74.4 kg)   SpO2 98%   BMI 32.03  kg/m     Wt Readings from Last 3 Encounters:  04/24/19 164 lb (74.4 kg)  10/08/16 160 lb 7 oz (72.8 kg)  05/13/14 170 lb (77.1 kg)     GEN: Obese female, well nourished, well developed in no acute distress HEENT: Normal NECK: No JVD; No carotid bruits LYMPHATICS: No lymphadenopathy CARDIAC: S1S2 noted,RRR, no murmurs, rubs, gallops RESPIRATORY:  Clear to auscultation without rales, wheezing or rhonchi  ABDOMEN: Soft, non-tender, non-distended, +bowel sounds, no guarding. EXTREMITIES: No edema, No cyanosis, no clubbing MUSCULOSKELETAL:  No edema; No deformity  SKIN: Warm and dry NEUROLOGIC:  Alert and oriented x 3, non-focal PSYCHIATRIC:  Normal affect, good insight  ASSESSMENT:    1. Benign hypertension   2. Mixed hyperlipidemia   3. Diabetes mellitus screening   4. Obesity (BMI 30.0-34.9)    PLAN:    1.  She is clinically doing well since her discharge.  She is continue to take her aspirin 81 mg daily, atorvastatin 10 mg daily, amlodipine 5 mg daily and Diovan-HCTZ 320-12.5 milligrams daily.  She has  had longstanding hypertension I am concerned about diastolic dysfunction therefore at this time is appropriate to get a transthoracic echocardiogram to assess this.  This will also give me information if there is any valvular abnormalities and LV wall thickness.  2.  Her blood pressure is controlled in the office today she will continue her current regimen of antihypertensive.   3. The patient is in agreement with the above plan. The patient left the office in stable condition.  The patient will follow up in 6 months unless if an earlier appointment is needed.   Medication Adjustments/Labs and Tests Ordered: Current medicines are reviewed at length with the patient today.  Concerns regarding medicines are outlined above.  Orders Placed This Encounter  Procedures  . Basic Metabolic Panel (BMET)  . Magnesium  . Lipid Profile  . HgB A1c  . EKG 12-Lead  . ECHOCARDIOGRAM COMPLETE   No orders of the defined types were placed in this encounter.   Patient Instructions  Medication Instructions:  Your physician recommends that you continue on your current medications as directed. Please refer to the Current Medication list given to you today.  If you need a refill on your cardiac medications before your next appointment, please call your pharmacy.   Lab work: Your physician recommends that you return for lab work in: 6 months [place]   If you have labs (blood work) drawn today and your tests are completely normal, you will receive your results only by: Marland Kitchen MyChart Message (if you have MyChart) OR . A paper copy in the mail If you have any lab test that is abnormal or we need to change your treatment, we will call you to review the results.  Testing/Procedures: Your physician has requested that you have an echocardiogram. Echocardiography is a painless test that uses sound waves to create images of your heart. It provides your doctor with information about the size  and shape of your heart and how well your heart's chambers and valves are working. This procedure takes approximately one hour. There are no restrictions for this procedure.    Follow-Up: At [facility], you and your health needs are our priority.  As part of our continuing mission to provide you with exceptional heart care, we have created designated Provider Care Teams.  These Care Teams include your primary Cardiologist (physician) and Advanced Practice Providers (APPs -  Physician Assistants and Nurse Practitioners)  who all work together to provide you with the care you need, when you need it. You will need a follow up appointment in 6 months.  Please call our office 2 months in advance to schedule this appointment.  You may see Berniece Salines, DO  Any Other Special Instructions Will Be Listed Below (If Applicable).      Adopting a Healthy Lifestyle.  Know what a healthy weight is for you (roughly BMI <25) and aim to maintain this   Aim for 7+ servings of fruits and vegetables daily   65-80+ fluid ounces of water or unsweet tea for healthy kidneys   Limit to max 1 drink of alcohol per day; avoid smoking/tobacco   Limit animal fats in diet for cholesterol and heart health - choose grass fed whenever available   Avoid highly processed foods, and foods high in saturated/trans fats   Aim for low stress - take time to unwind and care for your mental health   Aim for 150 min of moderate intensity exercise weekly for heart health, and weights twice weekly for bone health   Aim for 7-9 hours of sleep daily   When it comes to diets, agreement about the perfect plan isnt easy to find, even among the experts. Experts at the [employer] developed an idea known as the Healthy Eating Plate. Just imagine a plate divided into logical, healthy portions.   The emphasis is on diet quality:   Load up on vegetables and fruits - one-half of your plate: Aim for color and  variety, and remember that potatoes dont count.   Go for whole grains - one-quarter of your plate: Whole wheat, barley, wheat berries, quinoa, oats, brown rice, and foods made with them. If you want pasta, go with whole wheat pasta.   Protein power - one-quarter of your plate: Fish, chicken, beans, and nuts are all healthy, versatile protein sources. Limit red meat.   The diet, however, does go beyond the plate, offering a few other suggestions.   Use healthy plant oils, such as olive, canola, soy, corn, sunflower and peanut. Check the labels, and avoid partially hydrogenated oil, which have unhealthy trans fats.   If youre thirsty, drink water. Coffee and tea are good in moderation, but skip sugary drinks and limit milk and dairy products to one or two daily servings.   The type of carbohydrate in the diet is more important than the amount. Some sources of carbohydrates, such as vegetables, fruits, whole grains, and beans-are healthier than others.   Finally, stay active  Signed, Berniece Salines, DO  04/24/2019 3:36 PM    [facility]

## 2019-04-24 NOTE — Patient Instructions (Addendum)
Medication Instructions:  Your physician recommends that you continue on your current medications as directed. Please refer to the Current Medication list given to you today.  If you need a refill on your cardiac medications before your next appointment, please call your pharmacy.   Lab work: Your physician recommends that you return for lab work in: 6 months [place]   If you have labs (blood work) drawn today and your tests are completely normal, you will receive your results only by: Marland Kitchen MyChart Message (if you have MyChart) OR . A paper copy in the mail If you have any lab test that is abnormal or we need to change your treatment, we will call you to review the results.  Testing/Procedures: Your physician has requested that you have an echocardiogram. Echocardiography is a painless test that uses sound waves to create images of your heart. It provides your doctor with information about the size and shape of your heart and how well your heart's chambers and valves are working. This procedure takes approximately one hour. There are no restrictions for this procedure.    Follow-Up: At [facility], you and your health needs are our priority.  As part of our continuing mission to provide you with exceptional heart care, we have created designated Provider Care Teams.  These Care Teams include your primary Cardiologist (physician) and Advanced Practice Providers (APPs -  Physician Assistants and Nurse Practitioners) who all work together to provide you with the care you need, when you need it. You will need a follow up appointment in 6 months.  Please call our office 2 months in advance to schedule this appointment.  You may see Berniece Salines, DO  Any Other Special Instructions Will Be Listed Below (If Applicable).

## 2019-05-02 ENCOUNTER — Other Ambulatory Visit: Payer: Self-pay

## 2019-05-02 ENCOUNTER — Ambulatory Visit (INDEPENDENT_AMBULATORY_CARE_PROVIDER_SITE_OTHER): Payer: BC Managed Care – PPO

## 2019-05-02 ENCOUNTER — Encounter: Payer: Self-pay | Admitting: Sports Medicine

## 2019-05-02 ENCOUNTER — Other Ambulatory Visit: Payer: Self-pay | Admitting: Sports Medicine

## 2019-05-02 ENCOUNTER — Ambulatory Visit (INDEPENDENT_AMBULATORY_CARE_PROVIDER_SITE_OTHER): Payer: BC Managed Care – PPO | Admitting: Sports Medicine

## 2019-05-02 DIAGNOSIS — M2042 Other hammer toe(s) (acquired), left foot: Secondary | ICD-10-CM

## 2019-05-02 DIAGNOSIS — L84 Corns and callosities: Secondary | ICD-10-CM

## 2019-05-02 DIAGNOSIS — M79675 Pain in left toe(s): Secondary | ICD-10-CM | POA: Diagnosis not present

## 2019-05-02 DIAGNOSIS — M2022 Hallux rigidus, left foot: Secondary | ICD-10-CM

## 2019-05-02 DIAGNOSIS — M79672 Pain in left foot: Secondary | ICD-10-CM

## 2019-05-02 DIAGNOSIS — M2012 Hallux valgus (acquired), left foot: Secondary | ICD-10-CM | POA: Diagnosis not present

## 2019-05-02 NOTE — Patient Instructions (Signed)
Pre-Operative Instructions  Congratulations, you have decided to take an important step towards improving your quality of life.  You can be assured that the doctors and staff at [facility] will be with you every step of the way.  Here are some important things you should know:  1. Plan to be at the surgery center/hospital at least 1 (one) hour prior to your scheduled time, unless otherwise directed by the surgical center/hospital staff.  You must have a responsible adult accompany you, remain during the surgery and drive you home.  Make sure you have directions to the surgical center/hospital to ensure you arrive on time. 2. If you are having surgery at [facility] or [facility] hospitals, you will need a copy of your medical history and physical form from your family physician within one month prior to the date of surgery. We will give you a form for your primary physician to complete.  3. We make every effort to accommodate the date you request for surgery.  However, there are times where surgery dates or times have to be moved.  We will contact you as soon as possible if a change in schedule is required.   4. No aspirin/ibuprofen for one week before surgery.  If you are on aspirin, any non-steroidal anti-inflammatory medications (Mobic, Aleve, Ibuprofen) should not be taken seven (7) days prior to your surgery.  You make take Tylenol for pain prior to surgery.  5. Medications - If you are taking daily heart and blood pressure medications, seizure, reflux, allergy, asthma, anxiety, pain or diabetes medications, make sure you notify the surgery center/hospital before the day of surgery so they can tell you which medications you should take or avoid the day of surgery. 6. No food or drink after midnight the night before surgery unless directed otherwise by surgical center/hospital staff. 7. No alcoholic beverages 24-hours prior to surgery.  No smoking 24-hours prior or 24-hours after  surgery. 8. Wear loose pants or shorts. They should be loose enough to fit over bandages, boots, and casts. 9. Don't wear slip-on shoes. Sneakers are preferred. 10. Bring your boot with you to the surgery center/hospital.  Also bring crutches or a walker if your physician has prescribed it for you.  If you do not have this equipment, it will be provided for you after surgery. 11. If you have not been contacted by the surgery center/hospital by the day before your surgery, call to confirm the date and time of your surgery. 12. Leave-time from work may vary depending on the type of surgery you have.  Appropriate arrangements should be made prior to surgery with your employer. 13. Prescriptions will be provided immediately following surgery by your doctor.  Fill these as soon as possible after surgery and take the medication as directed. Pain medications will not be refilled on weekends and must be approved by the doctor. 14. Remove nail polish on the operative foot and avoid getting pedicures prior to surgery. 15. Wash the night before surgery.  The night before surgery wash the foot and leg well with water and the antibacterial soap provided. Be sure to pay special attention to beneath the toenails and in between the toes.  Wash for at least three (3) minutes. Rinse thoroughly with water and dry well with a towel.  Perform this wash unless told not to do so by your physician.  Enclosed: 1 Ice pack (please put in freezer the night before surgery)   1 Hibiclens skin cleaner     Pre-op instructions  If you have any questions regarding the instructions, please do not hesitate to call our office.  [place]: [address], [place], [place] [address] -- [phone]  [place]: [address] [place], [place] [address] -- [phone]  [place]: [address]  [place], [place] [address] -- [phone]   Website: [email or web address] 

## 2019-05-02 NOTE — Progress Notes (Signed)
Patient ID: Lauren Moon, female   DOB: [birth year], 73 y.o.   MRN: [id number]  Subjective: Lauren Moon is a 73 y.o. female patient seen today in office with complaint of painful corn at left second toe.  Patient reports that the pain is very severe over the last 2 months been hurting really bad worse with closed in her shoes has tried Epson salt alcohol red old cushion toe spacers but the toe sometimes swells and hurts and is painful reports by the end of her workday at [employer] she has a lot of pain and can only right now wear an open toe sandal.  Patient denies redness warmth drainage or any other acute symptoms at this time.  Patient Active Problem List   Diagnosis Date Noted  . Chest pain, atypical 04/24/2019  . Abnormal nuclear stress test 04/24/2019  . Acute deep vein thrombosis (DVT) of popliteal vein of right lower extremity ([place]) 04/24/2019  . Obesity (BMI 30.0-34.9) 04/24/2019  . Bilateral lower extremity edema 02/08/2017  . Left rotator cuff tear arthropathy 10/19/2016  . S/P shoulder replacement 10/19/2016  . Osteoarthritis 09/01/2016  . Osteopenia 09/01/2016  . Benign hypertension 11/05/2015  . Mixed hyperlipidemia 11/05/2015  . Varicose veins of leg with complications 0000000  . Varicose veins of lower extremities with other complications [id number]  . Varicose veins of lower extremity 04/16/2014  . Swelling of limb 12/31/2013   Current Outpatient Medications on File Prior to Visit  Medication Sig Dispense Refill  . amLODipine (NORVASC) 5 MG tablet TAKE 1 TABLET BY MOUTH ONCE DAILY IN THE MORNING FOR HIGH BLOOD PRESSURE    . aspirin 81 MG tablet Take 81 mg by mouth daily.    Marland Kitchen atorvastatin (LIPITOR) 10 MG tablet Take 10 mg by mouth daily.    . Calcium Carbonate-Vitamin D (CALTRATE 600+D PO) Take 1 tablet by mouth 2 (two) times daily.    . Multiple Vitamins-Minerals (MULTIVITAMIN WITH MINERALS) tablet Take 1 tablet by mouth daily.    . valsartan-hydrochlorothiazide  (DIOVAN-HCT) 320-12.5 MG tablet TAKE 1 TABLET BY MOUTH ONCE DAILY IN THE MORNING FOR BLOOD PRESSURE     No current facility-administered medications on file prior to visit.    Allergies  Allergen Reactions  . Penicillins   . Penicillin G Rash    Past Surgical History:  Procedure Laterality Date  . ABDOMINAL HYSTERECTOMY     ovaries removed as well  . CATARACT EXTRACTION W/ INTRAOCULAR LENS IMPLANT Bilateral    Done at [facility]  . JOINT REPLACEMENT Bilateral    knee, 1997, 2011  . SHOULDER ARTHROSCOPY     "cleaned my left shoulder out"   . TOTAL SHOULDER ARTHROPLASTY Left 09/2016  . TOTAL SHOULDER ARTHROPLASTY Left 10/19/2016   Procedure: TOTAL SHOULDER ARTHROPLASTY;  Surgeon: Marchia Bond, MD;  Location: [facility];  Service: Orthopedics;  Laterality: Left;  . TUBAL LIGATION    . VARICOSE VEIN SURGERY     Family History  Problem Relation Age of Onset  . Heart disease Mother        before age 35  . Hypertension Mother   . Varicose Veins Mother   . Cancer Sister   . Diabetes Sister   . Heart disease Sister        before age 27  . Hypertension Sister   . Peripheral vascular disease Sister   . Bleeding Disorder Sister   . Breast cancer Sister   . Cancer Brother   . Heart disease Brother  before age 65  . Hypertension Brother   . Stroke Brother        age 64   Social History   Socioeconomic History  . Marital status: Widowed    Spouse name: Not on file  . Number of children: Not on file  . Years of education: Not on file  . Highest education level: Not on file  Occupational History  . Not on file  Social Needs  . Financial resource strain: Not on file  . Food insecurity    Worry: Not on file    Inability: Not on file  . Transportation needs    Medical: Not on file    Non-medical: Not on file  Tobacco Use  . Smoking status: Never Smoker  . Smokeless tobacco: Never Used  Substance and Sexual Activity  . Alcohol use: No    Alcohol/week: 0.0  standard drinks  . Drug use: No  . Sexual activity: Not on file  Lifestyle  . Physical activity    Days per week: Not on file    Minutes per session: Not on file  . Stress: Not on file  Relationships  . Social Herbalist on phone: Not on file    Gets together: Not on file    Attends religious service: Not on file    Active member of club or organization: Not on file    Attends meetings of clubs or organizations: Not on file    Relationship status: Not on file  . Intimate partner violence    Fear of current or ex partner: Not on file    Emotionally abused: Not on file    Physically abused: Not on file    Forced sexual activity: Not on file  Other Topics Concern  . Not on file  Social History Narrative  . Not on file   Works at [employer]  Objective: Physical Exam  General: Well developed, nourished, no acute distress, awake, alert and oriented x 3  Vascular: Dorsalis pedis artery 2/4 bilateral, Posterior tibial artery 1/4 bilateral, skin temperature warm to warm proximal to distal bilateral lower extremities, mild varicosities, scant pedal hair present bilateral.  Neurological: Gross sensation present via light touch bilateral.   Dermatological: Skin is warm, dry, and supple bilateral, Nails 1-10 are short, thick, and discolored with moderate subungal debris, no webspace macerations present bilateral, no open lesions present bilateral, moderate hyperkeratotic tissue present at medial aspect of left 2nd toe from toes rubbing with no underlying opening or signs of infection bilateral.  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 Muscular strength within normal limits without pain. No pain with calf compression bilateral. Pes planus foot type bilateral.  X-ray left foot consistent with arthritis at the first metatarsal phalangeal joint mild  hallux deviation with impingement on the second toe mild lesser hammertoe deformities osseous mineralization appears to be within normal limits for patient's age and status mild soft tissue swelling no other acute findings.  Assessment and Plan:  Problem List Items Addressed This Visit    None    Visit Diagnoses    Corn of toe    -  Primary   Toe pain, left       Hammertoe of left foot       Hallux interphalangeus, acquired, left       Hallux rigidus of left foot         -  Examined patient.  -Discussed treatment options for painful corn of toe and toe deformity -X-rays reviewed -Patient declined to continue with conservative care at this time and elects for surgical intervention -Patient opt for surgical management. Consent obtained for left second hammertoe repair with excision of corn at the medial aspect, left Akin osteotomy and first metatarsophalangeal joint cheilectomy. Pre and Post op course explained. Risks, benefits, alternatives explained. No guarantees given or implied. Surgical booking slip submitted and provided patient with Surgical packet and info for [facility] -Patient desires to schedule surgery to be done after 3 weeks -To dispense CAM Walker to use post op at the surgical center' -Advised patient to continue with toe separator and good supportive shoes for the time being until time for surgery -Patient to return to office after surgery or sooner if problems or issues arise.  Landis Martins, DPM

## 2019-05-07 ENCOUNTER — Telehealth: Payer: Self-pay | Admitting: *Deleted

## 2019-05-07 NOTE — Telephone Encounter (Signed)
"  I am calling to schedule my surgery with Dr. Cannon Kettle.  If you could, whenever you get a chance, can you call back and let me know?"

## 2019-05-09 NOTE — Telephone Encounter (Signed)
I am returning your call.  Do you want to schedule your surgery?  "Yes, I would."  Dr. Cannon Kettle does surgery on Mondays.  Do you have a date that you like?  "I'd like to do it on October 29 if possible."  That's a Thursday.  She can do it on May 28, 2019.  "Is that the last Monday of the month?"  Yes, it is the last Monday of the month.  "Well I must have looked at my calendar wrong.  Yes, schedule me for then."

## 2019-05-10 ENCOUNTER — Telehealth: Payer: Self-pay | Admitting: Sports Medicine

## 2019-05-10 NOTE — Telephone Encounter (Signed)
DOS: 05/28/2019 SURGICAL PROCEDURES: Aiken Osteotomy Lt, Cheilectomy Hallux Lt, Hammertoe Repair 2nd Lt, Exc. Benign Lesion 1.0 cm 2nd Lt CPT CODES: 03474, 28289, 28285, [id number] DX CODES: M20.22, M20.40 (Hallux Interphangeus), M20.42, L84   Member Information   Member Number: [id number]  Policy Effective : [id number]  -  08/01/9998   Name: [id number] Manna  Date of Birth:[birth year]  Member Liability Summary       In-Network   Max Per Benefit Period Year-to-Date Remaining     CoInsurance         Deductible $1,750.00 $0.00     Out-Of-Pocket 3 $6,850.00 $4,917.98 3 Out-of-Pocket includes copay, deductible, and coinsurance.     Hospital - [facility]      In-Network Copay Coinsurance Authorization Required Not Applicable [id number]  per  Service Year No      Out of Network Copay Coinsurance Authorization Required Deductible Not Applicable Not Applicable No [id number]  per  Service Year Benefit Limits: [id number] remaining for Service Year      Out of Network Copay Coinsurance Authorization Required Not Applicable [id number]  per  Service Year No

## 2019-05-21 ENCOUNTER — Telehealth: Payer: Self-pay | Admitting: *Deleted

## 2019-05-21 NOTE — Telephone Encounter (Signed)
Pt called to see if her paperwork for [facility] had been received.

## 2019-05-23 ENCOUNTER — Telehealth (HOSPITAL_COMMUNITY): Payer: Self-pay | Admitting: *Deleted

## 2019-05-23 ENCOUNTER — Other Ambulatory Visit: Payer: Self-pay

## 2019-05-23 ENCOUNTER — Telehealth: Payer: Self-pay | Admitting: *Deleted

## 2019-05-23 DIAGNOSIS — I83899 Varicose veins of unspecified lower extremities with other complications: Secondary | ICD-10-CM

## 2019-05-23 NOTE — Telephone Encounter (Signed)

## 2019-05-23 NOTE — Telephone Encounter (Signed)
"  Dr. Cannon Kettle is my doctor.  I'm calling to reschedule my surgery.  We've decided to postpone it to the second Monday in November."  What date are you scheduled?"  "I'm scheduled for next Monday."  I will reschedule it.  "Do I need to call the surgery center?"  I will take care of it.  I called Caren Griffins at [facility] and rescheduled her surgery from 05/28/2019 to 110/04/2019.

## 2019-05-24 ENCOUNTER — Other Ambulatory Visit: Payer: Self-pay

## 2019-05-24 ENCOUNTER — Ambulatory Visit (HOSPITAL_COMMUNITY)
Admission: RE | Admit: 2019-05-24 | Discharge: 2019-05-24 | Disposition: A | Discharge status: Home / Self Care | Payer: BC Managed Care – PPO | Source: Ambulatory Visit | Attending: Family | Admitting: Family

## 2019-05-24 ENCOUNTER — Ambulatory Visit (INDEPENDENT_AMBULATORY_CARE_PROVIDER_SITE_OTHER): Payer: BC Managed Care – PPO | Admitting: Physician Assistant

## 2019-05-24 VITALS — BP 135/82 | HR 56 | Resp 14 | Ht 60.0 in | Wt 159.0 lb

## 2019-05-24 DIAGNOSIS — I872 Venous insufficiency (chronic) (peripheral): Secondary | ICD-10-CM | POA: Diagnosis not present

## 2019-05-24 DIAGNOSIS — I83899 Varicose veins of unspecified lower extremities with other complications: Secondary | ICD-10-CM | POA: Diagnosis present

## 2019-05-24 NOTE — Progress Notes (Signed)
[facility] [place]   Reason for referral: Swollen B legs  History of Present Illness  Lauren Moon is a 73 y.o. female who presents with chief complaint: swollen right leg with achy pain on a daily basis.  She has developed varicose veins posterior and lateral lower right leg.  No non healing wounds.She has been seen previously by both Dr. Trula Slade and Dr. Kellie Simmering.  She has a history of left LE popliteal DVT in February of 2015.  She was treated with oral anticoagulation.  She also reported having a previous DVT 10 years prior with large ulcer on her left ankle that healed.  She was referred to Dr. Garlon Hatchet for full hypercoagulable workup.  She was placed on 20-30 thigh-high compression stockings.    She was seen by Dr. Kellie Simmering for laser ablation left great saphenous vein from mid thigh to near saphenofemoral junction performed under local tumescent anesthesia.  At her F/U appt. A venous duplex demonstrated total closure of the left great saphenous vein to near the saphenofemoral junction.  She is planning left foot left second hammertoe repair with excision of corn at the medial aspect, left Akin osteotomy and first metatarsophalangeal joint cheilectomy in Nov. 2020 by Dr. Alveria Apley.    She takes Lipitor and Aspirin daily.    Past Medical History:  Diagnosis Date  . Abnormal nuclear stress test 04/24/2019  . Acute deep vein thrombosis (DVT) of popliteal vein of right lower extremity ([place]) 04/24/2019  . Acute venous embolism and thrombosis of unspecified deep vessels of lower extremity   . Arthritis   . Benign hypertension 11/05/2015   Overview:  1986  . Bilateral lower extremity edema 02/08/2017   Overview:  2018  . Chest pain, atypical 04/24/2019  . Heart murmur   . Hypertension   . Left rotator cuff tear arthropathy 10/19/2016  . Lumbago   . Mixed hyperlipidemia 11/05/2015  . Varicose veins of leg with complications [id number]  . Varicose veins of lower extremities with  other complications [id number]  . Varicose veins of lower extremity 04/16/2014    Past Surgical History:  Procedure Laterality Date  . ABDOMINAL HYSTERECTOMY     ovaries removed as well  . CATARACT EXTRACTION W/ INTRAOCULAR LENS IMPLANT Bilateral    Done at [facility]  . JOINT REPLACEMENT Bilateral    knee, 1997, 2011  . SHOULDER ARTHROSCOPY     "cleaned my left shoulder out"   . TOTAL SHOULDER ARTHROPLASTY Left 09/2016  . TOTAL SHOULDER ARTHROPLASTY Left 10/19/2016   Procedure: TOTAL SHOULDER ARTHROPLASTY;  Surgeon: Marchia Bond, MD;  Location: [place];  Service: Orthopedics;  Laterality: Left;  . TUBAL LIGATION    . VARICOSE VEIN SURGERY      Social History   Socioeconomic History  . Marital status: Widowed    Spouse name: Not on file  . Number of children: Not on file  . Years of education: Not on file  . Highest education level: Not on file  Occupational History  . Not on file  Social Needs  . Financial resource strain: Not on file  . Food insecurity    Worry: Not on file    Inability: Not on file  . Transportation needs    Medical: Not on file    Non-medical: Not on file  Tobacco Use  . Smoking status: Never Smoker  . Smokeless tobacco: Never Used  Substance and Sexual Activity  . Alcohol use: No    Alcohol/week: 0.0  standard drinks  . Drug use: No  . Sexual activity: Not on file  Lifestyle  . Physical activity    Days per week: Not on file    Minutes per session: Not on file  . Stress: Not on file  Relationships  . Social Herbalist on phone: Not on file    Gets together: Not on file    Attends religious service: Not on file    Active member of club or organization: Not on file    Attends meetings of clubs or organizations: Not on file    Relationship status: Not on file  . Intimate partner violence    Fear of current or ex partner: Not on file    Emotionally abused: Not on file    Physically abused: Not on file    Forced sexual  activity: Not on file  Other Topics Concern  . Not on file  Social History Narrative  . Not on file    Family History  Problem Relation Age of Onset  . Heart disease Mother        before age 88  . Hypertension Mother   . Varicose Veins Mother   . Cancer Sister   . Diabetes Sister   . Heart disease Sister        before age 22  . Hypertension Sister   . Peripheral vascular disease Sister   . Bleeding Disorder Sister   . Breast cancer Sister   . Cancer Brother   . Heart disease Brother        before age 14  . Hypertension Brother   . Stroke Brother        age 35    Current Outpatient Medications on File Prior to Visit  Medication Sig Dispense Refill  . amLODipine (NORVASC) 5 MG tablet TAKE 1 TABLET BY MOUTH ONCE DAILY IN THE MORNING FOR HIGH BLOOD PRESSURE    . aspirin 81 MG tablet Take 81 mg by mouth daily.    Marland Kitchen atorvastatin (LIPITOR) 10 MG tablet Take 10 mg by mouth daily.    . Calcium Carbonate-Vitamin D (CALTRATE 600+D PO) Take 1 tablet by mouth 2 (two) times daily.    . Multiple Vitamins-Minerals (MULTIVITAMIN WITH MINERALS) tablet Take 1 tablet by mouth daily.    . valsartan-hydrochlorothiazide (DIOVAN-HCT) 320-12.5 MG tablet TAKE 1 TABLET BY MOUTH ONCE DAILY IN THE MORNING FOR BLOOD PRESSURE     No current facility-administered medications on file prior to visit.     Allergies as of 05/24/2019 - Review Complete 05/24/2019  Allergen Reaction Noted  . Penicillins  11/30/2013  . Penicillin g Rash 10/15/2015     ROS:   General:  No weight loss, Fever, chills  HEENT: No recent headaches, no nasal bleeding, no visual changes, no sore throat  Neurologic: No dizziness, blackouts, seizures. No recent symptoms of stroke or mini- stroke. No recent episodes of slurred speech, or temporary blindness.  Cardiac: No recent episodes of chest pain/pressure, no shortness of breath at rest.  No shortness of breath with exertion.  Denies history of atrial fibrillation or  irregular heartbeat  Vascular: No history of rest pain in feet.  No history of claudication.  + history of non-healing ulcer, + history of DVT   Pulmonary: No home oxygen, no productive cough, no hemoptysis,  No asthma or wheezing  Musculoskeletal:  [ ]  Arthritis, [ ]  Low back pain,  x[ ]  Joint pain  Hematologic:No history of hypercoagulable state.  No history of easy bleeding.  No history of anemia  Gastrointestinal: No hematochezia or melena,  No gastroesophageal reflux, no trouble swallowing  Urinary: [ ]  chronic Kidney disease, [ ]  on HD - [ ]  MWF or [ ]  TTHS, [ ]  Burning with urination, [ ]  Frequent urination, [ ]  Difficulty urinating;   Skin: No rashes  Psychological: No history of anxiety,  No history of depression  Physical Examination  Vitals:   05/24/19 1443  BP: 135/82  Pulse: (!) 56  Resp: 14  SpO2: 100%  Weight: 159 lb (72.1 kg)  Height: 5' (1.524 m)    Body mass index is 31.05 kg/m.  General:  Alert and oriented, no acute distress HEENT: Normal Neck: No bruit or JVD Pulmonary: Clear to auscultation bilaterally Cardiac: Regular Rate and Rhythm without murmur Abdomen: Soft, non-tender, non-distended, no mass, no scars Skin: No rash Extremity Pulses:  2+ radial, brachial, femoral, dorsalis pedis pulses bilaterally           Neurologic: Upper and lower extremity motor 5/5 and symmetric  DATA:       Venous Reflux Times Normal value < 0.5 sec +------------------------------+----------+---------+                               Right (ms)Left (ms) +------------------------------+----------+---------+ CFV                           3535.00             +------------------------------+----------+---------+ FV                            2347.00   2200.00   +------------------------------+----------+---------+ Popliteal                     2090.00   1988.00   +------------------------------+----------+---------+ GSV at  Saphenofemoral junction2068.00   1702.00   +------------------------------+----------+---------+ GSV prox thigh                2398.00             +------------------------------+----------+---------+ SSV mid                                 528.00    +------------------------------+----------+---------+  +------------------------------+----------+---------+ VEIN DIAMETERS:               Right (cm)Left (cm) +------------------------------+----------+---------+ GSV at Saphenofemoral junction1.08      0.88      +------------------------------+----------+---------+ GSV at prox thigh             0.49                +------------------------------+----------+---------+ GSV at mid thigh              0.40                +------------------------------+----------+---------+ GSV at distal thigh           0.50                +------------------------------+----------+---------+ GSV at knee                   0.43                +------------------------------+----------+---------+ GSV prox calf  0.44                +------------------------------+----------+---------+ SSV prox                                0.29      +------------------------------+----------+---------+       Summary: Right: Abnormal reflux times were noted in the common femoral vein, femoral vein in the thigh, popliteal vein, great saphenous vein at the saphenofemoral junction, and great saphenous vein at the proximal thigh. There is no evidence of deep vein  thrombosis in the lower extremity. There is no evidence of superficial venous thrombosis. Left: No reflux was noted in the common femoral vein. Abnormal reflux times were noted in the femoral vein in the thigh, popliteal vein, great saphenous vein at the saphenofemoral junction, and mid small saphenous vein. Great saphenous vein remains  successfully closed; consistent with history of ablation. Findings consistent  with chronic deep vein thrombosis involving the left popliteal vein.    Assessment: History of Chronic left LE DVT and chronic edema with non healing medial ankle ulcer.  The ulcer has now completely healed. Right LE edema with varicose veins.    Plan: Thigh high compression 20-30 mmhg daily elevation when at rest and exercise as tolerates.  She will follow up for consideration of laser ablation on the right LE.    Roxy Horseman PA-C [facility] [place] Office: [phone]  MD in clinic [place]

## 2019-05-31 ENCOUNTER — Other Ambulatory Visit (HOSPITAL_COMMUNITY): Payer: Self-pay | Admitting: Cardiology

## 2019-05-31 ENCOUNTER — Ambulatory Visit (INDEPENDENT_AMBULATORY_CARE_PROVIDER_SITE_OTHER): Payer: BC Managed Care – PPO

## 2019-05-31 ENCOUNTER — Other Ambulatory Visit: Payer: Self-pay

## 2019-05-31 DIAGNOSIS — R079 Chest pain, unspecified: Secondary | ICD-10-CM | POA: Diagnosis not present

## 2019-05-31 NOTE — Progress Notes (Signed)
Complete echocardiogram has been performed.  Jimmy Jessica Seidman RDCS, RVT 

## 2019-06-06 ENCOUNTER — Encounter: Payer: BC Managed Care – PPO | Admitting: Sports Medicine

## 2019-06-10 ENCOUNTER — Other Ambulatory Visit: Payer: Self-pay | Admitting: Sports Medicine

## 2019-06-10 DIAGNOSIS — Z9889 Other specified postprocedural states: Secondary | ICD-10-CM

## 2019-06-10 NOTE — Progress Notes (Signed)
Post op meds entered  -Dr. Hazelyn Kallen 

## 2019-06-11 ENCOUNTER — Encounter: Payer: Self-pay | Admitting: Sports Medicine

## 2019-06-11 DIAGNOSIS — M13872 Other specified arthritis, left ankle and foot: Secondary | ICD-10-CM | POA: Diagnosis not present

## 2019-06-11 DIAGNOSIS — D2372 Other benign neoplasm of skin of left lower limb, including hip: Secondary | ICD-10-CM | POA: Diagnosis not present

## 2019-06-11 DIAGNOSIS — M2042 Other hammer toe(s) (acquired), left foot: Secondary | ICD-10-CM | POA: Diagnosis not present

## 2019-06-11 DIAGNOSIS — M2012 Hallux valgus (acquired), left foot: Secondary | ICD-10-CM | POA: Diagnosis not present

## 2019-06-11 HISTORY — PX: FOOT SURGERY: SHX648

## 2019-06-11 MED ORDER — PROMETHAZINE HCL 25 MG PO TABS: 25.0000 mg | ORAL_TABLET | Freq: Three times a day (TID) | ORAL | 0 refills | Status: DC | PRN

## 2019-06-11 MED ORDER — DOCUSATE SODIUM 100 MG PO CAPS: 100.0000 mg | ORAL_CAPSULE | Freq: Two times a day (BID) | ORAL | 0 refills | Status: DC

## 2019-06-11 MED ORDER — HYDROCODONE-ACETAMINOPHEN 10-325 MG PO TABS: 1.0000 | ORAL_TABLET | Freq: Four times a day (QID) | ORAL | 0 refills | Status: DC | PRN

## 2019-06-11 MED ORDER — IBUPROFEN 800 MG PO TABS: 800.0000 mg | ORAL_TABLET | Freq: Three times a day (TID) | ORAL | 0 refills | Status: DC | PRN

## 2019-06-12 ENCOUNTER — Telehealth: Payer: Self-pay | Admitting: Sports Medicine

## 2019-06-12 NOTE — Telephone Encounter (Signed)
Postop check phone call made to patient.  Patient some pain but reports that she is doing okay.  I recommended to patient to continue with taking her pain medicine and is scheduled to stay in front of the symptoms of pain.  Advised patient to take her Norco every 6 hours and Motrin in between for additional breakthrough pain or pain that is mild.  Advised patient to continue with rest ice elevation.  Advised patient to call office if there are any other problems or symptoms.  Patient expressed understanding and thanked me for calling. -Dr. Cannon Kettle

## 2019-06-13 ENCOUNTER — Encounter: Payer: BC Managed Care – PPO | Admitting: Sports Medicine

## 2019-06-20 ENCOUNTER — Other Ambulatory Visit: Payer: Self-pay | Admitting: Sports Medicine

## 2019-06-20 ENCOUNTER — Other Ambulatory Visit: Payer: Self-pay

## 2019-06-20 ENCOUNTER — Ambulatory Visit (INDEPENDENT_AMBULATORY_CARE_PROVIDER_SITE_OTHER): Payer: BC Managed Care – PPO | Admitting: Sports Medicine

## 2019-06-20 ENCOUNTER — Ambulatory Visit (INDEPENDENT_AMBULATORY_CARE_PROVIDER_SITE_OTHER): Payer: BC Managed Care – PPO

## 2019-06-20 ENCOUNTER — Encounter: Payer: Self-pay | Admitting: Sports Medicine

## 2019-06-20 VITALS — BP 125/75 | HR 63 | Temp 97.3°F | Resp 16

## 2019-06-20 DIAGNOSIS — M2042 Other hammer toe(s) (acquired), left foot: Secondary | ICD-10-CM

## 2019-06-20 DIAGNOSIS — L84 Corns and callosities: Secondary | ICD-10-CM

## 2019-06-20 DIAGNOSIS — Z9889 Other specified postprocedural states: Secondary | ICD-10-CM

## 2019-06-20 DIAGNOSIS — M79675 Pain in left toe(s): Secondary | ICD-10-CM

## 2019-06-20 DIAGNOSIS — M2022 Hallux rigidus, left foot: Secondary | ICD-10-CM

## 2019-06-20 DIAGNOSIS — M2012 Hallux valgus (acquired), left foot: Secondary | ICD-10-CM

## 2019-06-20 NOTE — Progress Notes (Signed)
Subjective: Lauren Moon is a 73 y.o. female patient seen today in office for POV #1 ([id number]), S/P excision of corn of toe and Akin osteotomy first toe with cheilectomy on left foot.  Patient denies pain at surgical site except at night when she goes to the bathroom without the boot she can feel pressure but no pain, denies calf pain, denies headache, chest pain, shortness of breath, nausea, vomiting, fever, or chills.No other issues noted.   Patient Active Problem List   Diagnosis Date Noted  . Chest pain, atypical 04/24/2019  . Abnormal nuclear stress test 04/24/2019  . Acute deep vein thrombosis (DVT) of popliteal vein of right lower extremity ([place]) 04/24/2019  . Obesity (BMI 30.0-34.9) 04/24/2019  . Bilateral lower extremity edema 02/08/2017  . Left rotator cuff tear arthropathy 10/19/2016  . S/P shoulder replacement 10/19/2016  . Osteoarthritis 09/01/2016  . Osteopenia 09/01/2016  . Benign hypertension 11/05/2015  . Mixed hyperlipidemia 11/05/2015  . Varicose veins of leg with complications 0000000  . Varicose veins of lower extremities with other complications [id number]  . Varicose veins of lower extremity 04/16/2014  . Swelling of limb 12/31/2013    Current Outpatient Medications on File Prior to Visit  Medication Sig Dispense Refill  . amLODipine (NORVASC) 5 MG tablet TAKE 1 TABLET BY MOUTH ONCE DAILY IN THE MORNING FOR HIGH BLOOD PRESSURE    . aspirin 81 MG tablet Take 81 mg by mouth daily.    Marland Kitchen atorvastatin (LIPITOR) 10 MG tablet Take 10 mg by mouth daily.    . Calcium Carbonate-Vitamin D (CALTRATE 600+D PO) Take 1 tablet by mouth 2 (two) times daily.    Marland Kitchen docusate sodium (COLACE) 100 MG capsule Take 1 capsule (100 mg total) by mouth 2 (two) times daily. 10 capsule 0  . HYDROcodone-acetaminophen (NORCO) 10-325 MG tablet Take 1 tablet by mouth every 6 (six) hours as needed. 28 tablet 0  . ibuprofen (ADVIL) 800 MG tablet Take 1 tablet (800 mg total) by mouth every 8  (eight) hours as needed. 30 tablet 0  . Multiple Vitamins-Minerals (MULTIVITAMIN WITH MINERALS) tablet Take 1 tablet by mouth daily.    . promethazine (PHENERGAN) 25 MG tablet Take 1 tablet (25 mg total) by mouth every 8 (eight) hours as needed for nausea or vomiting. 20 tablet 0  . valsartan-hydrochlorothiazide (DIOVAN-HCT) 320-12.5 MG tablet TAKE 1 TABLET BY MOUTH ONCE DAILY IN THE MORNING FOR BLOOD PRESSURE     No current facility-administered medications on file prior to visit.     Allergies  Allergen Reactions  . Penicillins   . Penicillin G Rash    Objective: There were no vitals filed for this visit.  General: No acute distress, AAOx3  Left foot: Sutures intact with no gapping or dehiscence at surgical site, mild swelling to left foot, no erythema, no warmth, no drainage, no signs of infection noted, Capillary fill time <3 seconds in all digits, gross sensation present via light touch to left foot. No pain or crepitation with range of motion left foot.  No pain with calf compression.   Post Op Xray, left foot excellent alignment and position at second toe at K wire however at the first toe there is mild subluxation of the osteotomy site however the staple is holding with some rotation however there is no backing out noted of the staple at this time. Soft tissue swelling within normal limits for post op status.   Assessment and Plan:  Problem List Items Addressed This Visit  None    Visit Diagnoses    Post-operative state    -  Primary   Corn of toe       Toe pain, left       Hammertoe of left foot       Hallux interphalangeus, acquired, left       Hallux rigidus of left foot          -Patient seen and evaluated -X-rays reviewed -Applied dry sterile dressing to surgical site left foot secured with ACE wrap and stockinet  -Advised patient to make sure to keep dressings clean, dry, and intact to left surgical site, removing the ACE as needed  -Advised patient to continue  with cam boot on left -Advised patient to limit activity to necessity  -Advised patient to ice and elevate as necessary  -Will plan for suture removal at next office visit. In the meantime, patient to call office if any issues or problems arise.   Landis Martins, DPM

## 2019-06-27 ENCOUNTER — Other Ambulatory Visit: Payer: Self-pay

## 2019-06-27 ENCOUNTER — Encounter: Payer: Self-pay | Admitting: Sports Medicine

## 2019-06-27 ENCOUNTER — Ambulatory Visit (INDEPENDENT_AMBULATORY_CARE_PROVIDER_SITE_OTHER): Payer: BC Managed Care – PPO | Admitting: Sports Medicine

## 2019-06-27 ENCOUNTER — Other Ambulatory Visit: Payer: Self-pay | Admitting: Sports Medicine

## 2019-06-27 DIAGNOSIS — L84 Corns and callosities: Secondary | ICD-10-CM

## 2019-06-27 DIAGNOSIS — M2042 Other hammer toe(s) (acquired), left foot: Secondary | ICD-10-CM

## 2019-06-27 DIAGNOSIS — Z9889 Other specified postprocedural states: Secondary | ICD-10-CM

## 2019-06-27 DIAGNOSIS — M79675 Pain in left toe(s): Secondary | ICD-10-CM

## 2019-06-27 DIAGNOSIS — M2012 Hallux valgus (acquired), left foot: Secondary | ICD-10-CM

## 2019-06-27 NOTE — Progress Notes (Signed)
Subjective: Lauren Moon is a 73 y.o. female patient seen today in office for POV #2 ([id number]), S/P excision of corn of toe and Akin osteotomy first toe with cheilectomy on left foot.  Patient denies pain at surgical site except on Monday had a little pain that is relived with pain medication, denies calf pain, denies headache, chest pain, shortness of breath, nausea, vomiting, fever, or chills.No other issues noted.   Patient Active Problem List   Diagnosis Date Noted  . Chest pain, atypical 04/24/2019  . Abnormal nuclear stress test 04/24/2019  . Acute deep vein thrombosis (DVT) of popliteal vein of right lower extremity ([place]) 04/24/2019  . Obesity (BMI 30.0-34.9) 04/24/2019  . Bilateral lower extremity edema 02/08/2017  . Left rotator cuff tear arthropathy 10/19/2016  . S/P shoulder replacement 10/19/2016  . Osteoarthritis 09/01/2016  . Osteopenia 09/01/2016  . Benign hypertension 11/05/2015  . Mixed hyperlipidemia 11/05/2015  . Varicose veins of leg with complications 0000000  . Varicose veins of lower extremities with other complications [id number]  . Varicose veins of lower extremity 04/16/2014  . Swelling of limb 12/31/2013    Current Outpatient Medications on File Prior to Visit  Medication Sig Dispense Refill  . amLODipine (NORVASC) 5 MG tablet TAKE 1 TABLET BY MOUTH ONCE DAILY IN THE MORNING FOR HIGH BLOOD PRESSURE    . aspirin 81 MG tablet Take 81 mg by mouth daily.    Marland Kitchen atorvastatin (LIPITOR) 10 MG tablet Take 10 mg by mouth daily.    . Calcium Carbonate-Vitamin D (CALTRATE 600+D PO) Take 1 tablet by mouth 2 (two) times daily.    Marland Kitchen docusate sodium (COLACE) 100 MG capsule Take 1 capsule (100 mg total) by mouth 2 (two) times daily. 10 capsule 0  . HYDROcodone-acetaminophen (NORCO) 10-325 MG tablet Take 1 tablet by mouth every 6 (six) hours as needed. 28 tablet 0  . ibuprofen (ADVIL) 800 MG tablet Take 1 tablet (800 mg total) by mouth every 8 (eight) hours as needed.  30 tablet 0  . Multiple Vitamins-Minerals (MULTIVITAMIN WITH MINERALS) tablet Take 1 tablet by mouth daily.    . promethazine (PHENERGAN) 25 MG tablet Take 1 tablet (25 mg total) by mouth every 8 (eight) hours as needed for nausea or vomiting. 20 tablet 0  . valsartan-hydrochlorothiazide (DIOVAN-HCT) 320-12.5 MG tablet TAKE 1 TABLET BY MOUTH ONCE DAILY IN THE MORNING FOR BLOOD PRESSURE     No current facility-administered medications on file prior to visit.     Allergies  Allergen Reactions  . Penicillins   . Penicillin G Rash    Objective: There were no vitals filed for this visit.  General: No acute distress, AAOx3  Left foot: Sutures intact with no gapping or dehiscence at surgical site, mild swelling to left foot, no erythema, no warmth, no drainage, no signs of infection noted, Capillary fill time <3 seconds in all digits, gross sensation present via light touch to left foot. No pain or crepitation with range of motion left foot.  No pain with calf compression.   Assessment and Plan:  Problem List Items Addressed This Visit    None    Visit Diagnoses    Post-operative state    -  Primary   Corn of toe       Toe pain, left       Hammertoe of left foot       Hallux interphalangeus, acquired, left          -Patient seen and evaluated -  Sutures removed -Applied dry sterile dressing to surgical site left foot secured with ACE wrap and stockinet  -Advised patient to make sure to keep dressings clean, dry, and intact to left surgical site, this week and may change as instructed on next week with dry dressings as provided  -Advised patient to continue with cam boot on left -Advised patient to limit activity to necessity  -Advised patient to ice and elevate as necessary  -Will plan for xray and k-wire removal at next office visit. In the meantime, patient to call office if any issues or problems arise.   Landis Martins, DPM

## 2019-07-06 ENCOUNTER — Telehealth: Payer: Self-pay | Admitting: Urology

## 2019-07-06 ENCOUNTER — Other Ambulatory Visit: Payer: Self-pay

## 2019-07-06 ENCOUNTER — Ambulatory Visit: Payer: BC Managed Care – PPO

## 2019-07-10 NOTE — Telephone Encounter (Signed)
error 

## 2019-07-11 ENCOUNTER — Encounter: Payer: Self-pay | Admitting: Sports Medicine

## 2019-07-11 ENCOUNTER — Other Ambulatory Visit: Payer: Self-pay

## 2019-07-11 ENCOUNTER — Ambulatory Visit (INDEPENDENT_AMBULATORY_CARE_PROVIDER_SITE_OTHER): Payer: BC Managed Care – PPO | Admitting: Sports Medicine

## 2019-07-11 ENCOUNTER — Other Ambulatory Visit: Payer: Self-pay | Admitting: Sports Medicine

## 2019-07-11 ENCOUNTER — Ambulatory Visit: Payer: BC Managed Care – PPO

## 2019-07-11 DIAGNOSIS — M2042 Other hammer toe(s) (acquired), left foot: Secondary | ICD-10-CM

## 2019-07-11 DIAGNOSIS — Z9889 Other specified postprocedural states: Secondary | ICD-10-CM

## 2019-07-11 DIAGNOSIS — M2012 Hallux valgus (acquired), left foot: Secondary | ICD-10-CM

## 2019-07-11 DIAGNOSIS — M79675 Pain in left toe(s): Secondary | ICD-10-CM

## 2019-07-11 DIAGNOSIS — L84 Corns and callosities: Secondary | ICD-10-CM

## 2019-07-11 DIAGNOSIS — M2022 Hallux rigidus, left foot: Secondary | ICD-10-CM

## 2019-07-11 NOTE — Progress Notes (Signed)
Subjective: Lauren Moon is a 73 y.o. female patient seen today in office for POV # 3 ([id number]), S/P excision of corn of toe and Akin osteotomy first toe with cheilectomy on left foot.  Patient denies pain at surgical site except a little bit of tingling but otherwise no major pains and is doing well, denies calf pain, denies headache, chest pain, shortness of breath, nausea, vomiting, fever, or chills.No other issues noted.   Patient Active Problem List   Diagnosis Date Noted  . Chest pain, atypical 04/24/2019  . Abnormal nuclear stress test 04/24/2019  . Acute deep vein thrombosis (DVT) of popliteal vein of right lower extremity ([place]) 04/24/2019  . Obesity (BMI 30.0-34.9) 04/24/2019  . Bilateral lower extremity edema 02/08/2017  . Left rotator cuff tear arthropathy 10/19/2016  . S/P shoulder replacement 10/19/2016  . Osteoarthritis 09/01/2016  . Osteopenia 09/01/2016  . Benign hypertension 11/05/2015  . Mixed hyperlipidemia 11/05/2015  . Varicose veins of leg with complications 0000000  . Varicose veins of lower extremities with other complications [id number]  . Varicose veins of lower extremity 04/16/2014  . Swelling of limb 12/31/2013    Current Outpatient Medications on File Prior to Visit  Medication Sig Dispense Refill  . amLODipine (NORVASC) 5 MG tablet TAKE 1 TABLET BY MOUTH ONCE DAILY IN THE MORNING FOR HIGH BLOOD PRESSURE    . aspirin 81 MG tablet Take 81 mg by mouth daily.    Marland Kitchen atorvastatin (LIPITOR) 10 MG tablet Take 10 mg by mouth daily.    . Calcium Carbonate-Vitamin D (CALTRATE 600+D PO) Take 1 tablet by mouth 2 (two) times daily.    Marland Kitchen docusate sodium (COLACE) 100 MG capsule Take 1 capsule (100 mg total) by mouth 2 (two) times daily. 10 capsule 0  . HYDROcodone-acetaminophen (NORCO) 10-325 MG tablet Take 1 tablet by mouth every 6 (six) hours as needed. 28 tablet 0  . ibuprofen (ADVIL) 800 MG tablet Take 1 tablet (800 mg total) by mouth every 8 (eight) hours as  needed. 30 tablet 0  . Multiple Vitamins-Minerals (MULTIVITAMIN WITH MINERALS) tablet Take 1 tablet by mouth daily.    . promethazine (PHENERGAN) 25 MG tablet Take 1 tablet (25 mg total) by mouth every 8 (eight) hours as needed for nausea or vomiting. 20 tablet 0  . valsartan-hydrochlorothiazide (DIOVAN-HCT) 320-12.5 MG tablet TAKE 1 TABLET BY MOUTH ONCE DAILY IN THE MORNING FOR BLOOD PRESSURE     No current facility-administered medications on file prior to visit.     Allergies  Allergen Reactions  . Penicillins   . Penicillin G Rash    Objective: There were no vitals filed for this visit.  General: No acute distress, AAOx3  Left foot: K wire at second toe intact, mild swelling to left foot, no erythema, no warmth, no drainage, no signs of infection noted, Capillary fill time <3 seconds in all digits, gross sensation present via light touch to left foot. No pain or crepitation with range of motion left foot.  No pain with calf compression.  No acute signs of DVT.  X-rays consistent with postoperative status  Assessment and Plan:  Problem List Items Addressed This Visit    None    Visit Diagnoses    Post-operative state    -  Primary   Corn of toe       Toe pain, left       Hammertoe of left foot       Hallux interphalangeus, acquired, left  Hallux rigidus of left foot          -Patient seen and evaluated -K wire removed -Applied dry sterile dressing to surgical site left foot secured with ACE wrap and stockinet  -Advised patient may remove dressings and shower on tomorrow -Dispensed postoperative shoe -Advised patient to limit activity to necessity  -Advised patient to ice and elevate as necessary  -Will plan for xray and transition to tennis shoe at next office visit. In the meantime, patient to call office if any issues or problems arise.   Landis Martins, DPM

## 2019-07-20 ENCOUNTER — Ambulatory Visit (INDEPENDENT_AMBULATORY_CARE_PROVIDER_SITE_OTHER): Payer: BC Managed Care – PPO

## 2019-07-20 DIAGNOSIS — M2042 Other hammer toe(s) (acquired), left foot: Secondary | ICD-10-CM

## 2019-07-20 DIAGNOSIS — Z9889 Other specified postprocedural states: Secondary | ICD-10-CM | POA: Diagnosis not present

## 2019-07-25 ENCOUNTER — Other Ambulatory Visit: Payer: Self-pay | Admitting: Sports Medicine

## 2019-07-25 DIAGNOSIS — M2042 Other hammer toe(s) (acquired), left foot: Secondary | ICD-10-CM

## 2019-07-25 DIAGNOSIS — Z9889 Other specified postprocedural states: Secondary | ICD-10-CM

## 2019-08-10 ENCOUNTER — Other Ambulatory Visit: Payer: Self-pay

## 2019-08-10 ENCOUNTER — Ambulatory Visit (INDEPENDENT_AMBULATORY_CARE_PROVIDER_SITE_OTHER): Payer: BC Managed Care – PPO | Admitting: Sports Medicine

## 2019-08-10 ENCOUNTER — Other Ambulatory Visit: Payer: Self-pay | Admitting: Sports Medicine

## 2019-08-10 ENCOUNTER — Ambulatory Visit (INDEPENDENT_AMBULATORY_CARE_PROVIDER_SITE_OTHER): Payer: BC Managed Care – PPO

## 2019-08-10 DIAGNOSIS — Z9889 Other specified postprocedural states: Secondary | ICD-10-CM

## 2019-08-10 DIAGNOSIS — M2012 Hallux valgus (acquired), left foot: Secondary | ICD-10-CM | POA: Diagnosis not present

## 2019-08-10 DIAGNOSIS — L84 Corns and callosities: Secondary | ICD-10-CM

## 2019-08-10 DIAGNOSIS — M79675 Pain in left toe(s): Secondary | ICD-10-CM

## 2019-08-10 DIAGNOSIS — M2022 Hallux rigidus, left foot: Secondary | ICD-10-CM

## 2019-08-10 DIAGNOSIS — M2042 Other hammer toe(s) (acquired), left foot: Secondary | ICD-10-CM

## 2019-08-10 NOTE — Progress Notes (Signed)
Subjective: Lauren Moon is a 74 y.o. female patient seen today in office for POV # 4 (DOS11-9-20), S/P excision of corn of toe and Akin osteotomy first toe with cheilectomy on left foot.  Patient denies pain at surgical site except a little bit 1/10 with swelling, denies calf pain, denies headache, chest pain, shortness of breath, nausea, vomiting, fever, or chills.No other issues noted.   Patient Active Problem List   Diagnosis Date Noted  . Chest pain, atypical 04/24/2019  . Abnormal nuclear stress test 04/24/2019  . Acute deep vein thrombosis (DVT) of popliteal vein of right lower extremity ([place]) 04/24/2019  . Obesity (BMI 30.0-34.9) 04/24/2019  . Bilateral lower extremity edema 02/08/2017  . Left rotator cuff tear arthropathy 10/19/2016  . S/P shoulder replacement 10/19/2016  . Osteoarthritis 09/01/2016  . Osteopenia 09/01/2016  . Benign hypertension 11/05/2015  . Mixed hyperlipidemia 11/05/2015  . Varicose veins of leg with complications 0000000  . Varicose veins of lower extremities with other complications [id number]  . Varicose veins of lower extremity 04/16/2014  . Swelling of limb 12/31/2013    Current Outpatient Medications on File Prior to Visit  Medication Sig Dispense Refill  . amLODipine (NORVASC) 5 MG tablet TAKE 1 TABLET BY MOUTH ONCE DAILY IN THE MORNING FOR HIGH BLOOD PRESSURE    . aspirin 81 MG tablet Take 81 mg by mouth daily.    Marland Kitchen atorvastatin (LIPITOR) 10 MG tablet Take 10 mg by mouth daily.    . Calcium Carbonate-Vitamin D (CALTRATE 600+D PO) Take 1 tablet by mouth 2 (two) times daily.    Marland Kitchen docusate sodium (COLACE) 100 MG capsule Take 1 capsule (100 mg total) by mouth 2 (two) times daily. 10 capsule 0  . HYDROcodone-acetaminophen (NORCO) 10-325 MG tablet Take 1 tablet by mouth every 6 (six) hours as needed. 28 tablet 0  . ibuprofen (ADVIL) 800 MG tablet Take 1 tablet (800 mg total) by mouth every 8 (eight) hours as needed. 30 tablet 0  . Multiple  Vitamins-Minerals (MULTIVITAMIN WITH MINERALS) tablet Take 1 tablet by mouth daily.    . promethazine (PHENERGAN) 25 MG tablet Take 1 tablet (25 mg total) by mouth every 8 (eight) hours as needed for nausea or vomiting. 20 tablet 0  . valsartan-hydrochlorothiazide (DIOVAN-HCT) 320-12.5 MG tablet TAKE 1 TABLET BY MOUTH ONCE DAILY IN THE MORNING FOR BLOOD PRESSURE     No current facility-administered medications on file prior to visit.    Allergies  Allergen Reactions  . Penicillins   . Penicillin G Rash    Objective: There were no vitals filed for this visit.  General: No acute distress, AAOx3  Left foot: Incisions well healed, no signs of infection noted, Capillary fill time <3 seconds in all digits, gross sensation present via light touch to left foot. No pain or crepitation with range of motion left foot.  No pain with calf compression.  No acute signs of DVT.  X-rays consistent with postoperative status  Assessment and Plan:  Problem List Items Addressed This Visit    None    Visit Diagnoses    Post-operative state    -  Primary   Corn of toe       Toe pain, left       Hallux interphalangeus, acquired, left       Hammertoe of left foot       Hallux rigidus of left foot          -Patient seen and evaluated -Xrays reviewed  -  May slowly transition to normal shoe as swelling allows -Continue with rest, ice, elevation and ACE wrap  -Continue with meds PRN  -Will plan for post op check and return to work on 09-11-19 at next office visit. In the meantime, patient to call office if any issues or problems arise.   Landis Martins, DPM

## 2019-08-22 ENCOUNTER — Other Ambulatory Visit: Payer: Self-pay | Admitting: Sports Medicine

## 2019-08-22 DIAGNOSIS — M2012 Hallux valgus (acquired), left foot: Secondary | ICD-10-CM

## 2019-08-23 DIAGNOSIS — M542 Cervicalgia: Secondary | ICD-10-CM | POA: Insufficient documentation

## 2019-08-30 ENCOUNTER — Other Ambulatory Visit: Payer: Self-pay

## 2019-08-30 ENCOUNTER — Ambulatory Visit (INDEPENDENT_AMBULATORY_CARE_PROVIDER_SITE_OTHER): Payer: BC Managed Care – PPO | Admitting: Vascular Surgery

## 2019-08-30 ENCOUNTER — Encounter: Payer: Self-pay | Admitting: Vascular Surgery

## 2019-08-30 VITALS — BP 117/74 | HR 68 | Temp 98.1°F | Resp 20 | Ht 60.0 in | Wt 170.0 lb

## 2019-08-30 DIAGNOSIS — I83899 Varicose veins of unspecified lower extremities with other complications: Secondary | ICD-10-CM

## 2019-08-30 NOTE — Progress Notes (Signed)
Patient name: Lauren Moon MRN: [id number] DOB: 1945/10/30 Sex: female  REASON FOR VISIT:   50-month follow-up visit  HPI:   Lauren Moon is a pleasant 74 y.o. female who was seen by Laurence Slate, PA on 05/24/2019 with chronic venous insufficiency.  She had presented with a swollen right leg with aching pain.  She has a history of a left popliteal DVT 10 years ago.  She had a venous stasis ulcer on her left ankle which ultimately healed.  Dr. Kellie Simmering had perform laser ablation of the left great saphenous vein from the mid thigh to the saphenofemoral junction previously.  On my history, the patient denies any significant leg pain or leg swelling.  She does have a remote history of a left popliteal DVT.  She is no longer on anticoagulation.  She also has a healed venous ulcer and has undergone previous laser ablation of the left great saphenous vein.  She has no real symptoms in the right leg.  She does work at [employer] and is on her feet for long hours.  She occasionally wears compression stockings.  She does try to elevate her legs some when she is "sitting up in a chair."  I do not get any history of claudication or rest pain.  Past Medical History:  Diagnosis Date  . Abnormal nuclear stress test 04/24/2019  . Acute deep vein thrombosis (DVT) of popliteal vein of right lower extremity ([place]) 04/24/2019  . Acute venous embolism and thrombosis of unspecified deep vessels of lower extremity   . Arthritis   . Benign hypertension 11/05/2015   Overview:  1986  . Bilateral lower extremity edema 02/08/2017   Overview:  2018  . Chest pain, atypical 04/24/2019  . Heart murmur   . Hypertension   . Left rotator cuff tear arthropathy 10/19/2016  . Lumbago   . Mixed hyperlipidemia 11/05/2015  . Varicose veins of leg with complications [id number]  . Varicose veins of lower extremities with other complications [id number]  . Varicose veins of lower extremity 04/16/2014    Family History  Problem  Relation Age of Onset  . Heart disease Mother        before age 75  . Hypertension Mother   . Varicose Veins Mother   . Cancer Sister   . Diabetes Sister   . Heart disease Sister        before age 36  . Hypertension Sister   . Peripheral vascular disease Sister   . Bleeding Disorder Sister   . Breast cancer Sister   . Cancer Brother   . Heart disease Brother        before age 67  . Hypertension Brother   . Stroke Brother        age 33    SOCIAL HISTORY: Social History   Tobacco Use  . Smoking status: Never Smoker  . Smokeless tobacco: Never Used  Substance Use Topics  . Alcohol use: No    Alcohol/week: 0.0 standard drinks    Allergies  Allergen Reactions  . Penicillins   . Penicillin G Rash    Current Outpatient Medications  Medication Sig Dispense Refill  . amLODipine (NORVASC) 5 MG tablet TAKE 1 TABLET BY MOUTH ONCE DAILY IN THE MORNING FOR HIGH BLOOD PRESSURE    . aspirin 81 MG tablet Take 81 mg by mouth daily.    Marland Kitchen atorvastatin (LIPITOR) 10 MG tablet Take 10 mg by mouth daily.    . Calcium Carbonate-Vitamin D (CALTRATE  600+D PO) Take 1 tablet by mouth 2 (two) times daily.    . Multiple Vitamins-Minerals (MULTIVITAMIN WITH MINERALS) tablet Take 1 tablet by mouth daily.    Marland Kitchen tiZANidine (ZANAFLEX) 4 MG capsule Take 4 mg by mouth 3 (three) times daily.    . valsartan-hydrochlorothiazide (DIOVAN-HCT) 320-12.5 MG tablet TAKE 1 TABLET BY MOUTH ONCE DAILY IN THE MORNING FOR BLOOD PRESSURE     No current facility-administered medications for this visit.    REVIEW OF SYSTEMS:  [X]  denotes positive finding, [ ]  denotes negative finding Cardiac  Comments:  Chest pain or chest pressure:    Shortness of breath upon exertion:    Short of breath when lying flat:    Irregular heart rhythm:        Vascular    Pain in calf, thigh, or hip brought on by ambulation:    Pain in feet at night that wakes you up from your sleep:     Blood clot in your veins:    Leg swelling:          Pulmonary    Oxygen at home:    Productive cough:     Wheezing:         Neurologic    Sudden weakness in arms or legs:     Sudden numbness in arms or legs:     Sudden onset of difficulty speaking or slurred speech:    Temporary loss of vision in one eye:     Problems with dizziness:         Gastrointestinal    Blood in stool:     Vomited blood:         Genitourinary    Burning when urinating:     Blood in urine:        Psychiatric    Major depression:         Hematologic    Bleeding problems:    Problems with blood clotting too easily:        Skin    Rashes or ulcers:        Constitutional    Fever or chills:     PHYSICAL EXAM:   Vitals:   08/30/19 1322  BP: 117/74  Pulse: 68  Resp: 20  Temp: 98.1 F (36.7 C)  SpO2: 100%  Weight: 170 lb (77.1 kg)  Height: 5' (1.524 m)    GENERAL: The patient is a well-nourished female, in no acute distress. The vital signs are documented above. CARDIAC: There is a regular rate and rhythm.  VASCULAR: I do not detect carotid bruits. On the right side she has a palpable dorsalis pedis and posterior tibial pulse. On the left side I cannot palpate pedal pulses however she has a biphasic dorsalis pedis and posterior tibial signal with the Doppler. She has a healed venous ulcer in the left medial leg. She has mild bilateral lower extremity swelling. I looked at her right great saphenous vein myself with the SonoSite and the vein is slightly dilated with reflux from the saphenofemoral junction to the mid thigh. PULMONARY: There is good air exchange bilaterally without wheezing or rales. ABDOMEN: Soft and non-tender with normal pitched bowel sounds.  MUSCULOSKELETAL: There are no major deformities or cyanosis. NEUROLOGIC: No focal weakness or paresthesias are detected. SKIN: There are no ulcers or rashes noted. PSYCHIATRIC: The patient has a normal affect.  DATA:    VENOUS DUPLEX: I reviewed the venous duplex scan that  was done in October.  On  the left side, the left great saphenous vein has been ablated.  On the right side there was deep venous reflux involving the common femoral vein, femoral vein, and popliteal vein.  There was superficial venous reflux involving the right great saphenous vein from the saphenofemoral junction to the mid thigh.  The vein was dilated up to 0.5 cm.  MEDICAL ISSUES:   CHRONIC VENOUS INSUFFICIENCY: This patient has evidence of chronic venous insufficiency.  She has CEAP C5 venous disease.  I have encouraged her to elevate her legs at the end of the day after she has been on her feet all day and we have discussed the proper positioning for this.  I explained that knee-high stockings with a gradient of 15 to 20 mmHg may be more practical for her at work.  I have encouraged her to avoid prolonged sitting and standing.  We have discussed the importance of exercise.  If her symptoms progress in the future she could potentially be considered for laser ablation of the right great saphenous vein however currently her symptoms are quite tolerable.  I will be happy to see her back in any time.  Deitra Mayo [facility] [phone]

## 2019-09-07 ENCOUNTER — Other Ambulatory Visit: Payer: Self-pay

## 2019-09-07 ENCOUNTER — Ambulatory Visit (INDEPENDENT_AMBULATORY_CARE_PROVIDER_SITE_OTHER): Payer: BC Managed Care – PPO | Admitting: Sports Medicine

## 2019-09-07 ENCOUNTER — Encounter: Payer: Self-pay | Admitting: Sports Medicine

## 2019-09-07 DIAGNOSIS — Z9889 Other specified postprocedural states: Secondary | ICD-10-CM

## 2019-09-07 DIAGNOSIS — M2042 Other hammer toe(s) (acquired), left foot: Secondary | ICD-10-CM

## 2019-09-07 DIAGNOSIS — M2022 Hallux rigidus, left foot: Secondary | ICD-10-CM

## 2019-09-07 DIAGNOSIS — L84 Corns and callosities: Secondary | ICD-10-CM

## 2019-09-07 DIAGNOSIS — M79675 Pain in left toe(s): Secondary | ICD-10-CM

## 2019-09-07 DIAGNOSIS — M2012 Hallux valgus (acquired), left foot: Secondary | ICD-10-CM

## 2019-09-07 NOTE — Progress Notes (Signed)
Subjective: Lauren Moon is a 74 y.o. female patient seen today in office for POV # 5 ([id number]), S/P excision of corn of toe and Akin osteotomy first toe with cheilectomy on left foot.  Patient denies pain at surgical site and reports that her foot does not hurt her like it use to and that the swelling is much better too, denies calf pain, denies headache, chest pain, shortness of breath, nausea, vomiting, fever, or chills. Reports that she has been using compression and icing as needed. No other issues noted.   Patient Active Problem List   Diagnosis Date Noted  . Chest pain, atypical 04/24/2019  . Abnormal nuclear stress test 04/24/2019  . Acute deep vein thrombosis (DVT) of popliteal vein of right lower extremity ([place]) 04/24/2019  . Obesity (BMI 30.0-34.9) 04/24/2019  . Bilateral lower extremity edema 02/08/2017  . Left rotator cuff tear arthropathy 10/19/2016  . S/P shoulder replacement 10/19/2016  . Osteoarthritis 09/01/2016  . Osteopenia 09/01/2016  . Benign hypertension 11/05/2015  . Mixed hyperlipidemia 11/05/2015  . Varicose veins of leg with complications 0000000  . Varicose veins of lower extremities with other complications [id number]  . Varicose veins of lower extremity 04/16/2014  . Swelling of limb 12/31/2013    Current Outpatient Medications on File Prior to Visit  Medication Sig Dispense Refill  . amLODipine (NORVASC) 5 MG tablet TAKE 1 TABLET BY MOUTH ONCE DAILY IN THE MORNING FOR HIGH BLOOD PRESSURE    . aspirin 81 MG tablet Take 81 mg by mouth daily.    Marland Kitchen atorvastatin (LIPITOR) 10 MG tablet Take 10 mg by mouth daily.    . Calcium Carbonate-Vitamin D (CALTRATE 600+D PO) Take 1 tablet by mouth 2 (two) times daily.    . Multiple Vitamins-Minerals (MULTIVITAMIN WITH MINERALS) tablet Take 1 tablet by mouth daily.    Marland Kitchen tiZANidine (ZANAFLEX) 4 MG capsule Take 4 mg by mouth 3 (three) times daily.    . valsartan-hydrochlorothiazide (DIOVAN-HCT) 320-12.5 MG tablet  TAKE 1 TABLET BY MOUTH ONCE DAILY IN THE MORNING FOR BLOOD PRESSURE     No current facility-administered medications on file prior to visit.    Allergies  Allergen Reactions  . Penicillins   . Penicillin G Rash    Objective: There were no vitals filed for this visit.  General: No acute distress, AAOx3  Left foot: Incisions well healed, no signs of infection noted, Capillary fill time <3 seconds in all digits, gross sensation present via light touch to left foot. No pain or crepitation with range of motion left foot.  Minimal swelling. No pain with calf compression. Strength within normal limits.   Assessment and Plan:  Problem List Items Addressed This Visit    None    Visit Diagnoses    Post-operative state    -  Primary   Corn of toe       Toe pain, left       Hallux interphalangeus, acquired, left       Hammertoe of left foot       Hallux rigidus of left foot          -Patient seen and evaluated -Continue with normal shoes  -Continue with rest, ice, elevation and compression sleeve as needed for swelling -Continue with meds PRN  -Continue with good supportive shoes daily  -Return to work next week 09-11-19 -Patient is discharged from post op care. Return as needed. In the meantime, patient to call office if any issues or problems arise.  Landis Martins, DPM

## 2019-10-11 ENCOUNTER — Ambulatory Visit (INDEPENDENT_AMBULATORY_CARE_PROVIDER_SITE_OTHER): Payer: BC Managed Care – PPO | Admitting: Sports Medicine

## 2019-10-11 ENCOUNTER — Ambulatory Visit (INDEPENDENT_AMBULATORY_CARE_PROVIDER_SITE_OTHER): Payer: BC Managed Care – PPO

## 2019-10-11 ENCOUNTER — Other Ambulatory Visit: Payer: Self-pay | Admitting: Sports Medicine

## 2019-10-11 ENCOUNTER — Encounter: Payer: Self-pay | Admitting: Sports Medicine

## 2019-10-11 ENCOUNTER — Other Ambulatory Visit: Payer: Self-pay

## 2019-10-11 DIAGNOSIS — M722 Plantar fascial fibromatosis: Secondary | ICD-10-CM

## 2019-10-11 DIAGNOSIS — M779 Enthesopathy, unspecified: Secondary | ICD-10-CM | POA: Diagnosis not present

## 2019-10-11 DIAGNOSIS — M19072 Primary osteoarthritis, left ankle and foot: Secondary | ICD-10-CM

## 2019-10-11 DIAGNOSIS — M25572 Pain in left ankle and joints of left foot: Secondary | ICD-10-CM

## 2019-10-11 DIAGNOSIS — M2140 Flat foot [pes planus] (acquired), unspecified foot: Secondary | ICD-10-CM

## 2019-10-11 DIAGNOSIS — M79672 Pain in left foot: Secondary | ICD-10-CM

## 2019-10-11 DIAGNOSIS — Z9889 Other specified postprocedural states: Secondary | ICD-10-CM

## 2019-10-11 MED ORDER — TRIAMCINOLONE ACETONIDE 10 MG/ML IJ SUSP
10.0000 mg | Freq: Once | INTRAMUSCULAR | Status: AC
  Administered 2019-10-11: 10 mg

## 2019-10-11 NOTE — Progress Notes (Signed)
Subjective: Lauren Moon is a 73 y.o. female patient seen today in office for POV # 6 ([id number]), S/P excision of corn of toe and Akin osteotomy first toe with cheilectomy on left foot.  Patient reports that she has a new pain along the bottom of the heel and the lateral ankle on the left reports that since she started back at work 1 month ago the pain has slowly increased sharp constant in nature worse with the first few steps out of bed in the morning or after a period of sitting and when she goes to stand up the pain increases reports that she has been elevating but has noticed some swelling on her ankle and has been trying to accommodate this by wearing her compression socks which seems to help some however there is still a significant amount of pain reports that she has been taking the pain pills that she had leftover from when she had foot surgery.  Patient denies any new trauma or injury.  Patient reports that she is back to work full-time normal schedule of 5 AM to 2 PM with walking and standing majority of her shift.  Patient denies any other pedal complaints or acute symptoms at this time.  Patient Active Problem List   Diagnosis Date Noted  . Chest pain, atypical 04/24/2019  . Abnormal nuclear stress test 04/24/2019  . Acute deep vein thrombosis (DVT) of popliteal vein of right lower extremity ([place]) 04/24/2019  . Obesity (BMI 30.0-34.9) 04/24/2019  . Bilateral lower extremity edema 02/08/2017  . Left rotator cuff tear arthropathy 10/19/2016  . S/P shoulder replacement 10/19/2016  . Osteoarthritis 09/01/2016  . Osteopenia 09/01/2016  . Benign hypertension 11/05/2015  . Mixed hyperlipidemia 11/05/2015  . Varicose veins of leg with complications 0000000  . Varicose veins of lower extremities with other complications [id number]  . Varicose veins of lower extremity 04/16/2014  . Swelling of limb 12/31/2013    Current Outpatient Medications on File Prior to Visit  Medication Sig  Dispense Refill  . amLODipine (NORVASC) 5 MG tablet TAKE 1 TABLET BY MOUTH ONCE DAILY IN THE MORNING FOR HIGH BLOOD PRESSURE    . aspirin 81 MG tablet Take 81 mg by mouth daily.    Marland Kitchen atorvastatin (LIPITOR) 10 MG tablet Take 10 mg by mouth daily.    . Calcium Carbonate-Vitamin D (CALTRATE 600+D PO) Take 1 tablet by mouth 2 (two) times daily.    . Multiple Vitamins-Minerals (MULTIVITAMIN WITH MINERALS) tablet Take 1 tablet by mouth daily.    Marland Kitchen tiZANidine (ZANAFLEX) 4 MG capsule Take 4 mg by mouth 3 (three) times daily.    . valsartan-hydrochlorothiazide (DIOVAN-HCT) 320-12.5 MG tablet TAKE 1 TABLET BY MOUTH ONCE DAILY IN THE MORNING FOR BLOOD PRESSURE     No current facility-administered medications on file prior to visit.    Allergies  Allergen Reactions  . Penicillins   . Penicillin G Rash    Objective: There were no vitals filed for this visit.  General: No acute distress, AAOx3  Left foot: Incisions well healed, no signs of infection noted, Capillary fill time <3 seconds in all digits, gross sensation present via light touch to left foot. No pain or crepitation with range of motion left foot.  Mild swelling noted to ankle especially the lateral aspect where there is pain along the lateral ankle and peroneal tendon course.  There is also pain to palpation at the plantar fascial insertion however the pain at the ankle is greater. No pain  with calf compression. Strength within normal limits with mild guarding at ankle.  Pes planus foot type.  X-rays consistent with postoperative status there is arthritis noted at the ankle but no other acute findings.  Assessment and Plan:  Problem List Items Addressed This Visit    None    Visit Diagnoses    Tendonitis    -  Primary   Relevant Medications   triamcinolone acetonide (KENALOG) 10 MG/ML injection 10 mg (Completed)   Plantar fasciitis of left foot       S/P foot surgery, left       Pes planus, unspecified laterality       Arthritis of  left foot       Relevant Medications   triamcinolone acetonide (KENALOG) 10 MG/ML injection 10 mg (Completed)      -Patient seen and evaluated -Discussed with patient treatment options for new onset tendinitis/fasciitis in the setting of arthritis of left foot and ankle -After oral consent and aseptic prep, injected a mixture containing 1 ml of 2%  plain lidocaine, 1 ml 0.5% plain marcaine, 0.5 ml of kenalog 10 and 0.5 ml of dexamethasone phosphate into left lateral ankle along the peroneal tendon course at area of most pain without complication. Post-injection care discussed with patient.  -Dispensed ankle gauntlet and instructed patient to use daily until she can get new tennis shoes -Shoe list provided -Advised patient to continue with wearing her compression sleeves for edema control as well as rest ice and elevation -Patient to call office if symptoms are not better after 2 weeks for follow-up or sooner if problems or issues arise. 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  Landis Martins, DPM

## 2019-10-11 NOTE — Patient Instructions (Signed)
For tennis shoes recommend:  Brooks Beast Ascis New balance Saucony Can be purchased at [employer] or [employer]  Vionic  SAS Can be purchased at [employer] or [employer]   For work shoes recommend: Sketchers Work Timberland boots  Can be purchased at a variety of places or [employer]   For casual shoes recommend: Vionic  Can be purchased at [employer] or [employer]   For Over the Counter Orthotics recommend: Power Steps Can be purchased in office/[facility] Superfeet Can be purchased at [employer] or [employer] Airplus Can be purchased at [employer]  

## 2019-10-24 ENCOUNTER — Ambulatory Visit (INDEPENDENT_AMBULATORY_CARE_PROVIDER_SITE_OTHER): Payer: BC Managed Care – PPO | Admitting: Cardiology

## 2019-10-24 ENCOUNTER — Other Ambulatory Visit: Payer: Self-pay

## 2019-10-24 ENCOUNTER — Other Ambulatory Visit: Payer: Self-pay | Admitting: Sports Medicine

## 2019-10-24 ENCOUNTER — Encounter: Payer: Self-pay | Admitting: Cardiology

## 2019-10-24 VITALS — BP 122/72 | HR 64 | Temp 97.5°F | Ht 60.0 in | Wt 171.0 lb

## 2019-10-24 DIAGNOSIS — I10 Essential (primary) hypertension: Secondary | ICD-10-CM | POA: Diagnosis not present

## 2019-10-24 DIAGNOSIS — E782 Mixed hyperlipidemia: Secondary | ICD-10-CM | POA: Diagnosis not present

## 2019-10-24 DIAGNOSIS — M722 Plantar fascial fibromatosis: Secondary | ICD-10-CM

## 2019-10-24 DIAGNOSIS — M779 Enthesopathy, unspecified: Secondary | ICD-10-CM

## 2019-10-24 NOTE — Progress Notes (Signed)
Cardiology Office Note:    Date:  10/24/2019   ID:  Lauren Moon, Lauren Moon [birth year], MRN [id number]  PCP:  Algis Greenhouse, MD  Cardiologist:  Berniece Salines, DO  Electrophysiologist:  None   Referring MD: Algis Greenhouse, MD   Follow-up visit  History of Present Illness:    Lauren Moon is a 74 y.o. female with a hx of hypertension, hyperlipidemia, mild mitral regurgitation presents today today for follow-up visit for high blood pressure.  She offers no complaints at this time.  She tells me that she has been doing well from a cardiovascular standpoint.  Past Medical History:  Diagnosis Date  . Abnormal nuclear stress test 04/24/2019  . Acute deep vein thrombosis (DVT) of popliteal vein of right lower extremity ([place]) 04/24/2019  . Acute venous embolism and thrombosis of unspecified deep vessels of lower extremity   . Arthritis   . Benign hypertension 11/05/2015   Overview:  1986  . Bilateral lower extremity edema 02/08/2017   Overview:  2018  . Chest pain, atypical 04/24/2019  . Heart murmur   . Hypertension   . Left rotator cuff tear arthropathy 10/19/2016  . Lumbago   . Mixed hyperlipidemia 11/05/2015  . Varicose veins of leg with complications [id number]  . Varicose veins of lower extremities with other complications [id number]  . Varicose veins of lower extremity 04/16/2014    Past Surgical History:  Procedure Laterality Date  . ABDOMINAL HYSTERECTOMY     ovaries removed as well  . CATARACT EXTRACTION W/ INTRAOCULAR LENS IMPLANT Bilateral    Done at [facility]  . FOOT SURGERY Left 06/11/2019  . JOINT REPLACEMENT Bilateral    knee, 1997, 2011  . SHOULDER ARTHROSCOPY     "cleaned my left shoulder out"   . TOTAL SHOULDER ARTHROPLASTY Left 09/2016  . TOTAL SHOULDER ARTHROPLASTY Left 10/19/2016   Procedure: TOTAL SHOULDER ARTHROPLASTY;  Surgeon: Marchia Bond, MD;  Location: [place];  Service: Orthopedics;  Laterality: Left;  . TUBAL LIGATION    . VARICOSE VEIN SURGERY       Current Medications: No outpatient medications have been marked as taking for the 10/24/19 encounter (Appointment) with Berniece Salines, DO.     Allergies:   Penicillins and Penicillin g   Social History   Socioeconomic History  . Marital status: Widowed    Spouse name: Not on file  . Number of children: Not on file  . Years of education: Not on file  . Highest education level: Not on file  Occupational History  . Not on file  Tobacco Use  . Smoking status: Never Smoker  . Smokeless tobacco: Never Used  Substance and Sexual Activity  . Alcohol use: No    Alcohol/week: 0.0 standard drinks  . Drug use: No  . Sexual activity: Not on file  Other Topics Concern  . Not on file  Social History Narrative  . Not on file   Social Determinants of Health   Financial Resource Strain:   . Difficulty of Paying Living Expenses:   Food Insecurity:   . Worried About Charity fundraiser in the Last Year:   . Arboriculturist in the Last Year:   Transportation Needs:   . Film/video editor (Medical):   Marland Kitchen Lack of Transportation (Non-Medical):   Physical Activity:   . Days of Exercise per Week:   . Minutes of Exercise per Session:   Stress:   . Feeling of Stress :  Social Connections:   . Frequency of Communication with Friends and Family:   . Frequency of Social Gatherings with Friends and Family:   . Attends Religious Services:   . Active Member of Clubs or Organizations:   . Attends Archivist Meetings:   Marland Kitchen Marital Status:      Family History: The patient's family history includes Bleeding Disorder in her sister; Breast cancer in her sister; Cancer in her brother and sister; Diabetes in her sister; Heart disease in her brother, mother, and sister; Hypertension in her brother, mother, and sister; Peripheral vascular disease in her sister; Stroke in her brother; Varicose Veins in her mother.  ROS:   Review of Systems  Constitution: Negative for decreased appetite,  fever and weight gain.  HENT: Negative for congestion, ear discharge, hoarse voice and sore throat.   Eyes: Negative for discharge, redness, vision loss in right eye and visual halos.  Cardiovascular: Negative for chest pain, dyspnea on exertion, leg swelling, orthopnea and palpitations.  Respiratory: Negative for cough, hemoptysis, shortness of breath and snoring.   Endocrine: Negative for heat intolerance and polyphagia.  Hematologic/Lymphatic: Negative for bleeding problem. Does not bruise/bleed easily.  Skin: Negative for flushing, nail changes, rash and suspicious lesions.  Musculoskeletal: Negative for arthritis, joint pain, muscle cramps, myalgias, neck pain and stiffness.  Gastrointestinal: Negative for abdominal pain, bowel incontinence, diarrhea and excessive appetite.  Genitourinary: Negative for decreased libido, genital sores and incomplete emptying.  Neurological: Negative for brief paralysis, focal weakness, headaches and loss of balance.  Psychiatric/Behavioral: Negative for altered mental status, depression and suicidal ideas.  Allergic/Immunologic: Negative for HIV exposure and persistent infections.    EKGs/Labs/Other Studies Reviewed:    The following studies were reviewed today:   EKG: None today.   Transthoracic echocardiogram.  iMPRESSIONS May 31, 2019 1. Left ventricular ejection fraction, by visual estimation, is 60 to  65%. The left ventricle has normal function. Left ventricular septal wall  thickness was moderately increased. Moderately increased left ventricular  posterior wall thickness. There is  moderately increased left ventricular hypertrophy.  2. Left ventricular diastolic parameters are consistent with Grade I  diastolic dysfunction (impaired relaxation).  3. The left ventricle has no regional wall motion abnormalities.  4. Global right ventricle has normal systolic function.The right  ventricular size is normal. No increase in right  ventricular wall  thickness.  5. Left atrial size was mild-moderately dilated.  6. Right atrial size was normal.  7. The mitral valve is normal in structure. Mild mitral valve  regurgitation. No evidence of mitral stenosis.  8. The tricuspid valve is normal in structure. Tricuspid valve  regurgitation is trivial.  9. The aortic valve is tricuspid. Aortic valve regurgitation is trivial.  No evidence of aortic valve sclerosis or stenosis.  10. The pulmonic valve was normal in structure. Pulmonic valve  regurgitation is trivial.  11. Normal pulmonary artery systolic pressure.  12. The inferior vena cava is normal in size with greater than 50%  respiratory variability, suggesting right atrial pressure of 3 mmHg.   FINDINGS  Left Ventricle: Left ventricular ejection fraction, by visual estimation,  is 60 to 65%. The left ventricle has normal function. The left ventricle  has no regional wall motion abnormalities. Moderately increased left  ventricular posterior wall thickness.  There is moderately increased left ventricular hypertrophy. Concentric  remodeling left ventricular hypertrophy. Left ventricular diastolic  parameters are consistent with Grade I diastolic dysfunction (impaired  relaxation). Normal left atrial pressure.  Right Ventricle: The right ventricular size is normal. No increase in  right ventricular wall thickness. Global RV systolic function is has  normal systolic function. The tricuspid regurgitant velocity is 2.21 m/s,  and with an assumed right atrial pressure  of 3 mmHg, the estimated right ventricular systolic pressure is normal at  22.5 mmHg.   Left Atrium: Left atrial size was mild-moderately dilated.   Right Atrium: Right atrial size was normal in size   Pericardium: There is no evidence of pericardial effusion.   Mitral Valve: The mitral valve is normal in structure. No evidence of  mitral valve stenosis by observation. Mild mitral valve  regurgitation.   Tricuspid Valve: The tricuspid valve is normal in structure. Tricuspid  valve regurgitation is trivial.   Aortic Valve: The aortic valve is tricuspid. Aortic valve regurgitation is  trivial. The aortic valve is structurally normal, with no evidence of  sclerosis or stenosis.   Pulmonic Valve: The pulmonic valve was normal in structure. Pulmonic valve  regurgitation is trivial. No evidence of pulmonic stenosis.   Aorta: The aortic root and ascending aorta are structurally normal, with  no evidence of dilitation.   Venous: The pulmonary veins were not well visualized. The inferior vena  cava is normal in size with greater than 50% respiratory variability,  suggesting right atrial pressure of 3 mmHg.   IAS/Shunts: No atrial level shunt detected by color flow Doppler. No  ventricular septal defect is seen or detected. There is no evidence of an  atrial septal defect.    Recent Labs: No results found for requested labs within last 8760 hours.  Recent Lipid Panel No results found for: CHOL, TRIG, HDL, CHOLHDL, VLDL, LDLCALC, LDLDIRECT  Physical Exam:    VS:  There were no vitals taken for this visit.    Wt Readings from Last 3 Encounters:  08/30/19 170 lb (77.1 kg)  05/24/19 159 lb (72.1 kg)  04/24/19 164 lb (74.4 kg)     GEN: Well nourished, well developed in no acute distress HEENT: Normal NECK: No JVD; No carotid bruits LYMPHATICS: No lymphadenopathy CARDIAC: S1S2 noted,RRR, no murmurs, rubs, gallops RESPIRATORY:  Clear to auscultation without rales, wheezing or rhonchi  ABDOMEN: Soft, non-tender, non-distended, +bowel sounds, no guarding. EXTREMITIES: Trace bilateral ankle edema, No cyanosis, no clubbing MUSCULOSKELETAL:  No deformity  SKIN: Warm and dry NEUROLOGIC:  Alert and oriented x 3, non-focal PSYCHIATRIC:  Normal affect, good insight  ASSESSMENT:    No diagnosis found. PLAN:     She is is doing well from a cardiovascular standpoint.   Her blood pressure is acceptable.  She will maintain her current medication regimen which includes amlodipine 5 mg daily, valsartan-hydrochlorothiazide 320-12.5 mg daily.  Hyperlipidemia-continue patient on her Lipitor 10 mg daily.  The patient is in agreement with the above plan. The patient left the office in stable condition.  The patient will follow up in 1 year.   Medication Adjustments/Labs and Tests Ordered: Current medicines are reviewed at length with the patient today.  Concerns regarding medicines are outlined above.  No orders of the defined types were placed in this encounter.  No orders of the defined types were placed in this encounter.   There are no Patient Instructions on file for this visit.   Adopting a Healthy Lifestyle.  Know what a healthy weight is for you (roughly BMI <25) and aim to maintain this   Aim for 7+ servings of fruits and vegetables daily   65-80+ fluid ounces  of water or unsweet tea for healthy kidneys   Limit to max 1 drink of alcohol per day; avoid smoking/tobacco   Limit animal fats in diet for cholesterol and heart health - choose grass fed whenever available   Avoid highly processed foods, and foods high in saturated/trans fats   Aim for low stress - take time to unwind and care for your mental health   Aim for 150 min of moderate intensity exercise weekly for heart health, and weights twice weekly for bone health   Aim for 7-9 hours of sleep daily   When it comes to diets, agreement about the perfect plan isnt easy to find, even among the experts. Experts at the [employer] developed an idea known as the Healthy Eating Plate. Just imagine a plate divided into logical, healthy portions.   The emphasis is on diet quality:   Load up on vegetables and fruits - one-half of your plate: Aim for color and variety, and remember that potatoes dont count.   Go for whole grains - one-quarter of your plate: Whole wheat,  barley, wheat berries, quinoa, oats, brown rice, and foods made with them. If you want pasta, go with whole wheat pasta.   Protein power - one-quarter of your plate: Fish, chicken, beans, and nuts are all healthy, versatile protein sources. Limit red meat.   The diet, however, does go beyond the plate, offering a few other suggestions.   Use healthy plant oils, such as olive, canola, soy, corn, sunflower and peanut. Check the labels, and avoid partially hydrogenated oil, which have unhealthy trans fats.   If youre thirsty, drink water. Coffee and tea are good in moderation, but skip sugary drinks and limit milk and dairy products to one or two daily servings.   The type of carbohydrate in the diet is more important than the amount. Some sources of carbohydrates, such as vegetables, fruits, whole grains, and beans-are healthier than others.   Finally, stay active  Signed, Berniece Salines, DO  10/24/2019 3:20 PM    [facility]

## 2019-10-24 NOTE — Patient Instructions (Signed)

## 2019-12-24 DIAGNOSIS — M5417 Radiculopathy, lumbosacral region: Secondary | ICD-10-CM | POA: Insufficient documentation

## 2020-10-27 DIAGNOSIS — R011 Cardiac murmur, unspecified: Secondary | ICD-10-CM | POA: Insufficient documentation

## 2020-10-27 DIAGNOSIS — M1990 Unspecified osteoarthritis, unspecified site: Secondary | ICD-10-CM | POA: Insufficient documentation

## 2020-10-27 DIAGNOSIS — I10 Essential (primary) hypertension: Secondary | ICD-10-CM | POA: Insufficient documentation

## 2020-10-28 ENCOUNTER — Ambulatory Visit (INDEPENDENT_AMBULATORY_CARE_PROVIDER_SITE_OTHER): Payer: BC Managed Care – PPO | Admitting: Cardiology

## 2020-10-28 ENCOUNTER — Encounter: Payer: Self-pay | Admitting: Cardiology

## 2020-10-28 ENCOUNTER — Other Ambulatory Visit: Payer: Self-pay

## 2020-10-28 VITALS — BP 136/68 | HR 63 | Ht 60.0 in | Wt 160.0 lb

## 2020-10-28 DIAGNOSIS — E782 Mixed hyperlipidemia: Secondary | ICD-10-CM | POA: Diagnosis not present

## 2020-10-28 DIAGNOSIS — I10 Essential (primary) hypertension: Secondary | ICD-10-CM | POA: Diagnosis not present

## 2020-10-28 DIAGNOSIS — E669 Obesity, unspecified: Secondary | ICD-10-CM | POA: Diagnosis not present

## 2020-10-28 NOTE — Progress Notes (Signed)
Cardiology Office Note:    Date:  10/28/2020   ID:  Lauren Moon, Lauren Moon 21-Oct-1945, MRN [id number]  PCP:  Algis Greenhouse, MD  Cardiologist:  Berniece Salines, DO  Electrophysiologist:  None   Referring MD: Algis Greenhouse, MD   Chief Complaint  Patient presents with  . Annual Exam    History of Present Illness:    Lauren Moon is a 75 y.o. female with a hx of hypertension, hyperlipidemia, mild mitral vegetation is here today for follow-up visit.  She offers no complaints at this time.  She tells me that she has been doing well from a cardiovascular standpoint.   Past Medical History:  Diagnosis Date  . Abnormal nuclear stress test 04/24/2019  . Acute deep vein thrombosis (DVT) of popliteal vein of right lower extremity ([place]) 04/24/2019  . Acute venous embolism and thrombosis of unspecified deep vessels of lower extremity   . Arthritis   . Benign hypertension 11/05/2015   Overview:  1986  . Bilateral lower extremity edema 02/08/2017   Overview:  2018  . Chest pain, atypical 04/24/2019  . Heart murmur   . Hypertension   . Left rotator cuff tear arthropathy 10/19/2016  . Lumbago   . Mixed hyperlipidemia 11/05/2015  . Varicose veins of leg with complications 19/09/7900  . Varicose veins of lower extremities with other complications 11/09/7351  . Varicose veins of lower extremity 04/16/2014    Past Surgical History:  Procedure Laterality Date  . ABDOMINAL HYSTERECTOMY     ovaries removed as well  . CATARACT EXTRACTION W/ INTRAOCULAR LENS IMPLANT Bilateral    Done at [facility]  . FOOT SURGERY Left 06/11/2019  . JOINT REPLACEMENT Bilateral    knee, 1997, 2011  . SHOULDER ARTHROSCOPY     "cleaned my left shoulder out"   . TOTAL SHOULDER ARTHROPLASTY Left 09/2016  . TOTAL SHOULDER ARTHROPLASTY Left 10/19/2016   Procedure: TOTAL SHOULDER ARTHROPLASTY;  Surgeon: Marchia Bond, MD;  Location: [facility];  Service: Orthopedics;  Laterality: Left;  . TUBAL LIGATION    . VARICOSE  VEIN SURGERY      Current Medications: Current Meds  Medication Sig  . amLODipine (NORVASC) 5 MG tablet TAKE 1 TABLET BY MOUTH ONCE DAILY IN THE MORNING FOR HIGH BLOOD PRESSURE  . atorvastatin (LIPITOR) 10 MG tablet Take 10 mg by mouth daily.  . Calcium Carbonate-Vitamin D (CALTRATE 600+D PO) Take 1 tablet by mouth 2 (two) times daily.  Marland Kitchen lidocaine (LIDODERM) 5 % Place 1 patch onto the skin as needed for pain.  . Multiple Vitamins-Minerals (MULTIVITAMIN WITH MINERALS) tablet Take 1 tablet by mouth daily.  . valsartan-hydrochlorothiazide (DIOVAN-HCT) 320-12.5 MG tablet TAKE 1 TABLET BY MOUTH ONCE DAILY IN THE MORNING FOR BLOOD PRESSURE     Allergies:   Gabapentin, Penicillins, and Penicillin g   Social History   Socioeconomic History  . Marital status: Widowed    Spouse name: Not on file  . Number of children: Not on file  . Years of education: Not on file  . Highest education level: Not on file  Occupational History  . Not on file  Tobacco Use  . Smoking status: Never Smoker  . Smokeless tobacco: Never Used  Vaping Use  . Vaping Use: Never used  Substance and Sexual Activity  . Alcohol use: No    Alcohol/week: 0.0 standard drinks  . Drug use: No  . Sexual activity: Not on file  Other Topics Concern  . Not on file  Social History Narrative  . Not on file   Social Determinants of Health   Financial Resource Strain: Not on file  Food Insecurity: Not on file  Transportation Needs: Not on file  Physical Activity: Not on file  Stress: Not on file  Social Connections: Not on file     Family History: The patient's family history includes Bleeding Disorder in her sister; Breast cancer in her sister; Cancer in her brother and sister; Diabetes in her sister; Heart disease in her brother, mother, and sister; Hypertension in her brother, mother, and sister; Peripheral vascular disease in her sister; Stroke in her brother; Varicose Veins in her mother.  ROS:   Review of  Systems  Constitution: Negative for decreased appetite, fever and weight gain.  HENT: Negative for congestion, ear discharge, hoarse voice and sore throat.   Eyes: Negative for discharge, redness, vision loss in right eye and visual halos.  Cardiovascular: Negative for chest pain, dyspnea on exertion, leg swelling, orthopnea and palpitations.  Respiratory: Negative for cough, hemoptysis, shortness of breath and snoring.   Endocrine: Negative for heat intolerance and polyphagia.  Hematologic/Lymphatic: Negative for bleeding problem. Does not bruise/bleed easily.  Skin: Negative for flushing, nail changes, rash and suspicious lesions.  Musculoskeletal: Negative for arthritis, joint pain, muscle cramps, myalgias, neck pain and stiffness.  Gastrointestinal: Negative for abdominal pain, bowel incontinence, diarrhea and excessive appetite.  Genitourinary: Negative for decreased libido, genital sores and incomplete emptying.  Neurological: Negative for brief paralysis, focal weakness, headaches and loss of balance.  Psychiatric/Behavioral: Negative for altered mental status, depression and suicidal ideas.  Allergic/Immunologic: Negative for HIV exposure and persistent infections.    EKGs/Labs/Other Studies Reviewed:    The following studies were reviewed today:   EKG:  The ekg ordered today demonstrates   Transthoracic echocardiogram.  iMPRESSIONS May 31, 2019 1. Left ventricular ejection fraction, by visual estimation, is 60 to  65%. The left ventricle has normal function. Left ventricular septal wall  thickness was moderately increased. Moderately increased left ventricular  posterior wall thickness. There is  moderately increased left ventricular hypertrophy.  2. Left ventricular diastolic parameters are consistent with Grade I  diastolic dysfunction (impaired relaxation).  3. The left ventricle has no regional wall motion abnormalities.  4. Global right ventricle has normal  systolic function.The right  ventricular size is normal. No increase in right ventricular wall  thickness.  5. Left atrial size was mild-moderately dilated.  6. Right atrial size was normal.  7. The mitral valve is normal in structure. Mild mitral valve  regurgitation. No evidence of mitral stenosis.  8. The tricuspid valve is normal in structure. Tricuspid valve  regurgitation is trivial.  9. The aortic valve is tricuspid. Aortic valve regurgitation is trivial.  No evidence of aortic valve sclerosis or stenosis.  10. The pulmonic valve was normal in structure. Pulmonic valve  regurgitation is trivial.  11. Normal pulmonary artery systolic pressure.  12. The inferior vena cava is normal in size with greater than 50%  respiratory variability, suggesting right atrial pressure of 3 mmHg.   FINDINGS  Left Ventricle: Left ventricular ejection fraction, by visual estimation,  is 60 to 65%. The left ventricle has normal function. The left ventricle  has no regional wall motion abnormalities. Moderately increased left  ventricular posterior wall thickness.  There is moderately increased left ventricular hypertrophy. Concentric  remodeling left ventricular hypertrophy. Left ventricular diastolic  parameters are consistent with Grade I diastolic dysfunction (impaired  relaxation). Normal left atrial  pressure.   Right Ventricle: The right ventricular size is normal. No increase in  right ventricular wall thickness. Global RV systolic function is has  normal systolic function. The tricuspid regurgitant velocity is 2.21 m/s,  and with an assumed right atrial pressure  of 3 mmHg, the estimated right ventricular systolic pressure is normal at  22.5 mmHg.   Left Atrium: Left atrial size was mild-moderately dilated.   Right Atrium: Right atrial size was normal in size   Pericardium: There is no evidence of pericardial effusion.   Mitral Valve: The mitral valve is normal in structure.  No evidence of  mitral valve stenosis by observation. Mild mitral valve regurgitation.   Tricuspid Valve: The tricuspid valve is normal in structure. Tricuspid  valve regurgitation is trivial.   Aortic Valve: The aortic valve is tricuspid. Aortic valve regurgitation is  trivial. The aortic valve is structurally normal, with no evidence of  sclerosis or stenosis.   Pulmonic Valve: The pulmonic valve was normal in structure. Pulmonic valve  regurgitation is trivial. No evidence of pulmonic stenosis.   Aorta: The aortic root and ascending aorta are structurally normal, with  no evidence of dilitation.   Venous: The pulmonary veins were not well visualized. The inferior vena  cava is normal in size with greater than 50% respiratory variability,  suggesting right atrial pressure of 3 mmHg.   IAS/Shunts: No atrial level shunt detected by color flow Doppler. No  ventricular septal defect is seen or detected. There is no evidence of an  atrial septal defect.   Recent Labs: No results found for requested labs within last 8760 hours.  Recent Lipid Panel No results found for: CHOL, TRIG, HDL, CHOLHDL, VLDL, LDLCALC, LDLDIRECT  Physical Exam:    VS:  BP (!) 90/58 (BP Location: Left Arm, Patient Position: Sitting, Cuff Size: Normal)   Pulse 63   Ht 5' (1.524 m)   Wt 160 lb (72.6 kg)   SpO2 97%   BMI 31.25 kg/m     Wt Readings from Last 3 Encounters:  10/28/20 160 lb (72.6 kg)  10/24/19 171 lb (77.6 kg)  08/30/19 170 lb (77.1 kg)     GEN: Well nourished, well developed in no acute distress HEENT: Normal NECK: No JVD; No carotid bruits LYMPHATICS: No lymphadenopathy CARDIAC: S1S2 noted,RRR, no murmurs, rubs, gallops RESPIRATORY:  Clear to auscultation without rales, wheezing or rhonchi  ABDOMEN: Soft, non-tender, non-distended, +bowel sounds, no guarding. EXTREMITIES: No edema, No cyanosis, no clubbing MUSCULOSKELETAL:  No deformity  SKIN: Warm and dry NEUROLOGIC:  Alert and  oriented x 3, non-focal PSYCHIATRIC:  Normal affect, good insight  ASSESSMENT:    1. Hypertension, unspecified type   2. Obesity (BMI 30.0-34.9)   3. Mixed hyperlipidemia    PLAN:    Her blood pressure which was done manually by me was 136/68 mmHg.  No changes will be made in her medication regimen.  In addition she will continue on her atorvastatin 10 mg daily. The patient understands the need to lose weight with diet and exercise. We have discussed specific strategies for this.   The patient is in agreement with the above plan. The patient left the office in stable condition.  The patient will follow up in 1 year   Medication Adjustments/Labs and Tests Ordered: Current medicines are reviewed at length with the patient today.  Concerns regarding medicines are outlined above.  Orders Placed This Encounter  Procedures  . EKG 12-Lead   No orders of the  defined types were placed in this encounter.   Patient Instructions  Medication Instructions:  Your physician recommends that you continue on your current medications as directed. Please refer to the Current Medication list given to you today.  *If you need a refill on your cardiac medications before your next appointment, please call your pharmacy*   Lab Work: None If you have labs (blood work) drawn today and your tests are completely normal, you will receive your results only by: Marland Kitchen MyChart Message (if you have MyChart) OR . A paper copy in the mail If you have any lab test that is abnormal or we need to change your treatment, we will call you to review the results.   Testing/Procedures: None   Follow-Up: At [facility], you and your health needs are our priority.  As part of our continuing mission to provide you with exceptional heart care, we have created designated Provider Care Teams.  These Care Teams include your primary Cardiologist (physician) and Advanced Practice Providers (APPs -  Physician Assistants and  Nurse Practitioners) who all work together to provide you with the care you need, when you need it.  We recommend signing up for the patient portal called "MyChart".  Sign up information is provided on this After Visit Summary.  MyChart is used to connect with patients for Virtual Visits (Telemedicine).  Patients are able to view lab/test results, encounter notes, upcoming appointments, etc.  Non-urgent messages can be sent to your provider as well.   To learn more about what you can do with MyChart, go to [email or web address].    Your next appointment:   1 year(s)  The format for your next appointment:   In Person  Provider:   Berniece Salines, DO   Other Instructions      Adopting a Healthy Lifestyle.  Know what a healthy weight is for you (roughly BMI <25) and aim to maintain this   Aim for 7+ servings of fruits and vegetables daily   65-80+ fluid ounces of water or unsweet tea for healthy kidneys   Limit to max 1 drink of alcohol per day; avoid smoking/tobacco   Limit animal fats in diet for cholesterol and heart health - choose grass fed whenever available   Avoid highly processed foods, and foods high in saturated/trans fats   Aim for low stress - take time to unwind and care for your mental health   Aim for 150 min of moderate intensity exercise weekly for heart health, and weights twice weekly for bone health   Aim for 7-9 hours of sleep daily   When it comes to diets, agreement about the perfect plan isnt easy to find, even among the experts. Experts at the [employer] developed an idea known as the Healthy Eating Plate. Just imagine a plate divided into logical, healthy portions.   The emphasis is on diet quality:   Load up on vegetables and fruits - one-half of your plate: Aim for color and variety, and remember that potatoes dont count.   Go for whole grains - one-quarter of your plate: Whole wheat, barley, wheat berries, quinoa, oats,  brown rice, and foods made with them. If you want pasta, go with whole wheat pasta.   Protein power - one-quarter of your plate: Fish, chicken, beans, and nuts are all healthy, versatile protein sources. Limit red meat.   The diet, however, does go beyond the plate, offering a few other suggestions.   Use healthy plant oils,  such as olive, canola, soy, corn, sunflower and peanut. Check the labels, and avoid partially hydrogenated oil, which have unhealthy trans fats.   If youre thirsty, drink water. Coffee and tea are good in moderation, but skip sugary drinks and limit milk and dairy products to one or two daily servings.   The type of carbohydrate in the diet is more important than the amount. Some sources of carbohydrates, such as vegetables, fruits, whole grains, and beans-are healthier than others.   Finally, stay active  Signed, Berniece Salines, DO  10/28/2020 4:09 PM    [facility]

## 2020-10-28 NOTE — Patient Instructions (Addendum)

## 2020-10-29 ENCOUNTER — Ambulatory Visit (INDEPENDENT_AMBULATORY_CARE_PROVIDER_SITE_OTHER): Payer: BC Managed Care – PPO | Admitting: Sports Medicine

## 2020-10-29 ENCOUNTER — Encounter: Payer: Self-pay | Admitting: Sports Medicine

## 2020-10-29 ENCOUNTER — Other Ambulatory Visit: Payer: Self-pay | Admitting: Sports Medicine

## 2020-10-29 ENCOUNTER — Ambulatory Visit (INDEPENDENT_AMBULATORY_CARE_PROVIDER_SITE_OTHER): Payer: BC Managed Care – PPO

## 2020-10-29 DIAGNOSIS — M779 Enthesopathy, unspecified: Secondary | ICD-10-CM | POA: Diagnosis not present

## 2020-10-29 DIAGNOSIS — M2140 Flat foot [pes planus] (acquired), unspecified foot: Secondary | ICD-10-CM

## 2020-10-29 DIAGNOSIS — M7752 Other enthesopathy of left foot: Secondary | ICD-10-CM | POA: Diagnosis not present

## 2020-10-29 DIAGNOSIS — M79672 Pain in left foot: Secondary | ICD-10-CM

## 2020-10-29 DIAGNOSIS — M19072 Primary osteoarthritis, left ankle and foot: Secondary | ICD-10-CM

## 2020-10-29 MED ORDER — TRIAMCINOLONE ACETONIDE 10 MG/ML IJ SUSP
10.0000 mg | Freq: Once | INTRAMUSCULAR | Status: AC
Start: 2020-10-29 — End: 2020-10-29
  Administered 2020-10-29: 10 mg

## 2020-10-29 NOTE — Progress Notes (Signed)
Subjective: Lauren Moon is a 75 y.o. female patient who presents to office for evaluation of left foot pain in the arch.  Patient reports that pain has been slowly getting worse over the last few weeks reports that there are certain shoes she cannot wear due to pain at her arch states that her bones sticks out and tends to rub and sometimes it gets so sore had a hard time standing and walking reports that she had tried an arch strap that has not given her complete relief. Denies injury/trip/fall/sprain/any causative factors.   Patient Active Problem List   Diagnosis Date Noted  . Hypertension   . Heart murmur   . Arthritis   . Neck pain 08/23/2019  . Obesity (BMI 30.0-34.9) 04/24/2019  . Chest pain, atypical 04/12/2019  . Abnormal nuclear stress test 04/12/2019  . Acute deep vein thrombosis (DVT) of popliteal vein ([facility]) 02/26/2019  . Family history of premature coronary artery disease 02/08/2019  . Localized edema 02/08/2019  . Right calf pain 02/08/2019  . Elevated creatine kinase 09/06/2018  . Pain of left lower extremity 07/11/2018  . Arthropathy of cervical facet joint 05/11/2018  . Spinal stenosis of lumbar region without neurogenic claudication 05/11/2018  . Post menopausal syndrome 01/26/2018  . Muscle spasm 08/18/2017  . Bilateral lower extremity edema 02/08/2017  . Left rotator cuff tear arthropathy 10/19/2016  . S/P shoulder replacement 10/19/2016  . Osteoarthritis 09/01/2016  . Osteopenia 09/01/2016  . Low back pain 09/01/2016  . Rotator cuff disorder, unspecified laterality 09/01/2016  . Benign hypertension 11/05/2015  . Mixed hyperlipidemia 11/05/2015  . Varicose veins of leg with complications 95/04/3266  . Varicose veins of lower extremities with other complications 12/45/8099  . Varicose veins of lower extremity 04/16/2014  . Swelling of limb 12/31/2013    Current Outpatient Medications on File Prior to Visit  Medication Sig Dispense Refill  . amLODipine  (NORVASC) 5 MG tablet TAKE 1 TABLET BY MOUTH ONCE DAILY IN THE MORNING FOR HIGH BLOOD PRESSURE    . atorvastatin (LIPITOR) 10 MG tablet Take 10 mg by mouth daily.    . Calcium Carbonate-Vitamin D (CALTRATE 600+D PO) Take 1 tablet by mouth 2 (two) times daily.    Marland Kitchen lidocaine (LIDODERM) 5 % Place 1 patch onto the skin as needed for pain.    . Multiple Vitamins-Minerals (MULTIVITAMIN WITH MINERALS) tablet Take 1 tablet by mouth daily.    . valsartan-hydrochlorothiazide (DIOVAN-HCT) 320-12.5 MG tablet TAKE 1 TABLET BY MOUTH ONCE DAILY IN THE MORNING FOR BLOOD PRESSURE     No current facility-administered medications on file prior to visit.    Allergies  Allergen Reactions  . Gabapentin     Other reaction(s): Other (See Comments) Muscles cramp  . Penicillins   . Penicillin G Rash    Objective:  General: Alert and oriented x3 in no acute distress  Dermatology: No open lesions bilateral lower extremities, no webspace macerations, no ecchymosis bilateral, previous surgical scars well-healed.  All nails x 10 are well manicured.  Vascular: Dorsalis Pedis and Posterior Tibial pedal pulses faintly palpable, Capillary Fill Time 3 seconds, venous hyperpigmentation bilateral lower extremities.  No pedal hair growth bilateral, no edema bilateral lower extremities, Temperature gradient within normal limits.  Neurology: Johney Maine sensation intact via light touch bilateral.  Musculoskeletal: Mild tenderness with palpation over the course of the posterior tibial tendon and medial foot and ankle especially over TN joint where there is a bulge noted with significant pes planus deformity on left  Gait: Antalgic gait  Xrays  Left foot   Impression: Staple intact at first toe.  There is significant diffuse arthritis, there is significant midtarsal breech and pes planus deformity with significant talar head uncovering and ankle valgus consistent with severe pes planus deformity nearing close to  end-stage.  Assessment and Plan: Problem List Items Addressed This Visit   None   Visit Diagnoses    Tendonitis    -  Primary   Relevant Medications   triamcinolone acetonide (KENALOG) 10 MG/ML injection 10 mg (Completed) (Start on 10/29/2020  5:45 PM)   Arthritis of left foot       Relevant Medications   triamcinolone acetonide (KENALOG) 10 MG/ML injection 10 mg (Completed) (Start on 10/29/2020  5:45 PM)   Pes planus, unspecified laterality           -Complete examination performed -Xrays reviewed -Discussed treatment options for tendinitis secondary to flatfeet with arthritis -After oral consent and aseptic prep, injected a mixture containing 1 ml of 2%  plain lidocaine, 1 ml 0.5% plain marcaine, 0.5 ml of kenalog 10 and 0.5 ml of dexamethasone phosphate into left medial ankle along the posterior tibial tendon course without complication. Post-injection care discussed with patient.  -Dispensed Tri-Lock ankle brace for patient to use as instructed -Advised rest ice elevation and no work for today -Continue with good supportive shoes -Patient to return to office if no better in a month or sooner if condition worsens.  Landis Martins, DPM

## 2020-10-30 ENCOUNTER — Other Ambulatory Visit: Payer: Self-pay | Admitting: Sports Medicine

## 2020-10-30 DIAGNOSIS — M779 Enthesopathy, unspecified: Secondary | ICD-10-CM

## 2020-11-19 DIAGNOSIS — M6282 Rhabdomyolysis: Secondary | ICD-10-CM | POA: Insufficient documentation

## 2020-12-02 ENCOUNTER — Ambulatory Visit: Payer: BC Managed Care – PPO | Admitting: Sports Medicine

## 2020-12-05 ENCOUNTER — Ambulatory Visit: Payer: BC Managed Care – PPO | Admitting: Sports Medicine

## 2021-05-05 DIAGNOSIS — B029 Zoster without complications: Secondary | ICD-10-CM | POA: Insufficient documentation

## 2021-05-05 DIAGNOSIS — R1032 Left lower quadrant pain: Secondary | ICD-10-CM | POA: Insufficient documentation

## 2021-06-02 DIAGNOSIS — J042 Acute laryngotracheitis: Secondary | ICD-10-CM | POA: Insufficient documentation

## 2022-01-04 DIAGNOSIS — M791 Myalgia, unspecified site: Secondary | ICD-10-CM | POA: Insufficient documentation

## 2022-04-12 ENCOUNTER — Encounter: Payer: Self-pay | Admitting: Podiatry

## 2022-04-12 ENCOUNTER — Ambulatory Visit (INDEPENDENT_AMBULATORY_CARE_PROVIDER_SITE_OTHER): Payer: BC Managed Care – PPO

## 2022-04-12 ENCOUNTER — Ambulatory Visit (INDEPENDENT_AMBULATORY_CARE_PROVIDER_SITE_OTHER): Payer: BC Managed Care – PPO | Admitting: Podiatry

## 2022-04-12 DIAGNOSIS — M2022 Hallux rigidus, left foot: Secondary | ICD-10-CM

## 2022-04-12 DIAGNOSIS — M19071 Primary osteoarthritis, right ankle and foot: Secondary | ICD-10-CM | POA: Diagnosis not present

## 2022-04-12 DIAGNOSIS — M2142 Flat foot [pes planus] (acquired), left foot: Secondary | ICD-10-CM

## 2022-04-12 DIAGNOSIS — M2140 Flat foot [pes planus] (acquired), unspecified foot: Secondary | ICD-10-CM

## 2022-04-12 DIAGNOSIS — M21611 Bunion of right foot: Secondary | ICD-10-CM

## 2022-04-12 DIAGNOSIS — M19072 Primary osteoarthritis, left ankle and foot: Secondary | ICD-10-CM

## 2022-04-12 DIAGNOSIS — M2141 Flat foot [pes planus] (acquired), right foot: Secondary | ICD-10-CM

## 2022-04-12 NOTE — Progress Notes (Signed)
Subjective:  Patient ID: Lauren Moon, female    DOB: January 24, 1946,  MRN: [id number]  Chief Complaint  Patient presents with   Foot Pain    Left foot pain under the big toe feels like she is walking on a metal rod  Right bunion causing discomfort    76 y.o. female presents with the above complaint. Patient presents for left and right foot pain. On the right foot she has a bunion deformity that has been causing her pain for months to years. Has gotten worse over time now causing her constant pain with pressure on the bump. Has tried getting wider shoes but not effective at reducing her pain. Works at [employer] and on her feet all day at work.   On her left foot, she has pain under the left great toe and great toe joint. She states this has gotten worse over last few months. Started after she had prior left foot surgery for bunion correction and corn removal. States she feels like walking on a painful metal/bone on bottom of left great toe.   Review of Systems: Negative except as noted in the HPI. Denies N/V/F/Ch.  Past Medical History:  Diagnosis Date   Abnormal nuclear stress test 04/24/2019   Acute deep vein thrombosis (DVT) of popliteal vein of right lower extremity ([place]) 04/24/2019   Acute venous embolism and thrombosis of unspecified deep vessels of lower extremity    Arthritis    Benign hypertension 11/05/2015   Overview:  1986   Bilateral lower extremity edema 02/08/2017   Overview:  2018   Chest pain, atypical 04/24/2019   Heart murmur    Hypertension    Left rotator cuff tear arthropathy 10/19/2016   Lumbago    Mixed hyperlipidemia 11/05/2015   Varicose veins of leg with complications 44/03/1855   Varicose veins of lower extremities with other complications 10/13/9700   Varicose veins of lower extremity 04/16/2014    Current Outpatient Medications:    metaxalone (SKELAXIN) 400 MG tablet, Take by mouth., Disp: , Rfl:    amLODipine (NORVASC) 5 MG tablet, TAKE 1 TABLET BY MOUTH ONCE  DAILY IN THE MORNING FOR HIGH BLOOD PRESSURE, Disp: , Rfl:    atorvastatin (LIPITOR) 10 MG tablet, Take 10 mg by mouth daily., Disp: , Rfl:    calcium carbonate (OSCAL) 1500 (600 Ca) MG TABS tablet, Take by mouth., Disp: , Rfl:    Calcium Carbonate-Vitamin D (CALTRATE 600+D PO), Take 1 tablet by mouth 2 (two) times daily., Disp: , Rfl:    lidocaine (LIDODERM) 5 %, Place 1 patch onto the skin as needed for pain., Disp: , Rfl:    Multiple Vitamins-Minerals (MULTIVITAMIN WITH MINERALS) tablet, Take 1 tablet by mouth daily., Disp: , Rfl:    tiZANidine (ZANAFLEX) 4 MG tablet, Take 4 mg by mouth every 6 (six) hours as needed., Disp: , Rfl:    valsartan-hydrochlorothiazide (DIOVAN-HCT) 320-12.5 MG tablet, TAKE 1 TABLET BY MOUTH ONCE DAILY IN THE MORNING FOR BLOOD PRESSURE, Disp: , Rfl:   Social History   Tobacco Use  Smoking Status Never  Smokeless Tobacco Never    Allergies  Allergen Reactions   Atorvastatin     Other reaction(s): Other (See Comments) 12/03/2020: Elevated CK with thigh pain, uncertain connection   Gabapentin     Other reaction(s): Other (See Comments) Muscles cramp   Penicillins    Penicillin G Rash   Objective:  There were no vitals filed for this visit. There is no height or weight on  file to calculate BMI. Constitutional Well developed. Well nourished.  Vascular Dorsalis pedis pulses palpable bilaterally. Posterior tibial pulses palpable bilaterally. Capillary refill normal to all digits.  No cyanosis or clubbing noted. Pedal hair growth normal.  Neurologic Normal speech. Oriented to person, place, and time. Epicritic sensation to light touch grossly present bilaterally.  Dermatologic Nails well groomed and normal in appearance. No open wounds. No skin lesions.  Orthopedic: Normal joint ROM without pain or crepitus bilaterally. Hallux abductovalgus deformity present to the right foot moderate/severe.  Left 1st MPJ severely diminished range with pain. Left 1st  TMT with gross hypermobility. Prominent plantar base of the proximal phalanx left hallux with pain on palpation of this area and plantar 1st MPJ left foot.  Right 1st MPJ diminished range of motion  Right 1st TMT with gross hypermobility. Lesser digital contractures present bilaterally.   Radiographs: Right and left foot XR Taken and reviewed. 3 views WB Ap lateral Oblique Right foot: Hallux abductovalgus deformity present. Met adductus present. 1st/2nd IMA: 14 degrees; TSP: 5-6  3 views weightbearing AP lateral oblique left foot: Attention directed to the first MPJ of the left foot there is any significant degenerative changes including diminished joint space, spur formation first metatarsal elevation and hypertrophy of the plantar base of the proximal phalanx.  Base the proximal phalanx appears significantly large and prominent plantarly.  Surgical staple intact does not appear to be prominent in the proximal phalanx.   Assessment:   1. Bunion of right foot   2. Pes planus, unspecified laterality   3. Hallux rigidus, left foot   4. Primary osteoarthritis of both feet    Plan:  Patient was evaluated and treated and all questions answered.  #Right foot Hallux abductovalgus deformity,  -XR as above. -Patient has failed all conservative therapy and wishes to proceed with surgical intervention. All risks, benefits, and alternatives discussed with patient. No guarantees given. Marland Kitchen Post-op course explained at length. -Planned procedures: I discussed both minimally invasive bunionectomy with distal first metatarsal osteotomy as well as Lapidus bunionectomy with the patient explained the difference between procedures as well as differences in postoperative recovery.  I believe either of these would be a feasible procedure for the patient however given her excessive hypermobility and moderate to severe deformity I would likely recommend Lapidus bunionectomy. -Patient would like to plan for right  foot bunionectomy later this year or early next probably after the holidays.  Therefore I will have her come back in 3 months for a presurgical consultation to finalize surgical plan as well as discuss pre and postoperative medication management.  In the meantime continue wearing wide toe box shoes. -Risk factors: Cardiovascular disease and prior DVT  #Hallux rigidus of the left first MPJ with prominent plantar first proximal phalanx base -Discussed with the patient that I believe her issue is primarily related to hypertrophy and prominence of the plantar proximal base of the proximal phalanx.  I would therefore recommend fusion of the first metatarsophalangeal joint with resection of this prominence.  She states she would like to hold off on this procedure for now and focus more on the right foot.  However we may come back to the left foot in the future.  In the meantime I recommend cushioned shoes and inserts to offload the area underneath the first MPJ that is causing her pain.  She can use topical anti-inflammatory medication to the area.  Return in about 3 months (around 07/12/2022).

## 2022-04-12 NOTE — Patient Instructions (Signed)
It was nice to meet you today. If you have any questions or any further concerns, please feel fee to give me a call. You can call our office at [phone] or please feel fee to send me a message through [facility].

## 2022-07-12 ENCOUNTER — Ambulatory Visit (INDEPENDENT_AMBULATORY_CARE_PROVIDER_SITE_OTHER): Payer: BC Managed Care – PPO | Admitting: Podiatry

## 2022-07-12 DIAGNOSIS — Z91199 Patient's noncompliance with other medical treatment and regimen due to unspecified reason: Secondary | ICD-10-CM

## 2022-07-12 NOTE — Progress Notes (Signed)
Pt was a no show for apt, no charge 

## 2022-07-13 ENCOUNTER — Ambulatory Visit (INDEPENDENT_AMBULATORY_CARE_PROVIDER_SITE_OTHER): Payer: BC Managed Care – PPO | Admitting: Podiatry

## 2022-07-13 DIAGNOSIS — M205X1 Other deformities of toe(s) (acquired), right foot: Secondary | ICD-10-CM

## 2022-07-13 DIAGNOSIS — M19071 Primary osteoarthritis, right ankle and foot: Secondary | ICD-10-CM | POA: Diagnosis not present

## 2022-07-13 DIAGNOSIS — M2022 Hallux rigidus, left foot: Secondary | ICD-10-CM

## 2022-07-13 DIAGNOSIS — M19072 Primary osteoarthritis, left ankle and foot: Secondary | ICD-10-CM

## 2022-07-13 DIAGNOSIS — M21611 Bunion of right foot: Secondary | ICD-10-CM | POA: Diagnosis not present

## 2022-07-13 NOTE — Progress Notes (Signed)
Subjective:  Patient ID: Lauren Moon, female    DOB: 02/06/1946,  MRN: [id number]  Chief Complaint  Patient presents with   Consult    Surgery consult    76 y.o. female presents for follow-up of right foot bunion pain.  We have previously discussed surgical intervention at last visit but patient wanted to wait to come back and schedule surgery at a later time.  She is now presenting for preoperative surgical consent and to further discuss what procedure would be performed.  On her left foot, she has pain under the left great toe and great toe joint.  Pain is stable from prior.  She feels like she is walking on painful metal or bone on the bottom of the left great toe.  Review of Systems: Negative except as noted in the HPI. Denies N/V/F/Ch.  Past Medical History:  Diagnosis Date   Abnormal nuclear stress test 04/24/2019   Acute deep vein thrombosis (DVT) of popliteal vein of right lower extremity ([place]) 04/24/2019   Acute venous embolism and thrombosis of unspecified deep vessels of lower extremity    Arthritis    Benign hypertension 11/05/2015   Overview:  1986   Bilateral lower extremity edema 02/08/2017   Overview:  2018   Chest pain, atypical 04/24/2019   Heart murmur    Hypertension    Left rotator cuff tear arthropathy 10/19/2016   Lumbago    Mixed hyperlipidemia 11/05/2015   Varicose veins of leg with complications 20/04/4708   Varicose veins of lower extremities with other complications 01/27/3661   Varicose veins of lower extremity 04/16/2014    Current Outpatient Medications:    amLODipine (NORVASC) 5 MG tablet, TAKE 1 TABLET BY MOUTH ONCE DAILY IN THE MORNING FOR HIGH BLOOD PRESSURE, Disp: , Rfl:    atorvastatin (LIPITOR) 10 MG tablet, Take 10 mg by mouth daily., Disp: , Rfl:    calcium carbonate (OSCAL) 1500 (600 Ca) MG TABS tablet, Take by mouth., Disp: , Rfl:    Calcium Carbonate-Vitamin D (CALTRATE 600+D PO), Take 1 tablet by mouth 2 (two) times daily., Disp: , Rfl:     lidocaine (LIDODERM) 5 %, Place 1 patch onto the skin as needed for pain., Disp: , Rfl:    metaxalone (SKELAXIN) 400 MG tablet, Take by mouth., Disp: , Rfl:    Multiple Vitamins-Minerals (MULTIVITAMIN WITH MINERALS) tablet, Take 1 tablet by mouth daily., Disp: , Rfl:    tiZANidine (ZANAFLEX) 4 MG tablet, Take 4 mg by mouth every 6 (six) hours as needed., Disp: , Rfl:    valsartan-hydrochlorothiazide (DIOVAN-HCT) 320-12.5 MG tablet, TAKE 1 TABLET BY MOUTH ONCE DAILY IN THE MORNING FOR BLOOD PRESSURE, Disp: , Rfl:   Social History   Tobacco Use  Smoking Status Never  Smokeless Tobacco Never    Allergies  Allergen Reactions   Atorvastatin     Other reaction(s): Other (See Comments) 12/03/2020: Elevated CK with thigh pain, uncertain connection   Gabapentin     Other reaction(s): Other (See Comments) Muscles cramp   Penicillins    Penicillin G Rash   Objective:  There were no vitals filed for this visit. There is no height or weight on file to calculate BMI. Constitutional Well developed. Well nourished.  Vascular Dorsalis pedis pulses palpable bilaterally. Posterior tibial pulses palpable bilaterally. Capillary refill normal to all digits.  No cyanosis or clubbing noted. Pedal hair growth normal.  Neurologic Normal speech. Oriented to person, place, and time. Epicritic sensation to light touch grossly present  bilaterally.  Dermatologic Nails well groomed and normal in appearance. No open wounds. No skin lesions.  Orthopedic: Normal joint ROM without pain or crepitus bilaterally. Hallux abductovalgus deformity present to the right foot moderate/severe.  Left 1st MPJ severely diminished range with pain. Left 1st TMT with gross hypermobility. Prominent plantar base of the proximal phalanx left hallux with pain on palpation of this area and plantar 1st MPJ left foot.  Right 1st MPJ diminished range of motion  Right 1st TMT with gross hypermobility. Lesser digital contractures  present bilaterally.   Radiographs: 04/12/2022 Right and left foot XR Taken and reviewed. 3 views WB Ap lateral Oblique Right foot: Hallux abductovalgus deformity present. Met adductus present. 1st/2nd IMA: 14 degrees; TSP: 5-6  3 views weightbearing AP lateral oblique left foot: Attention directed to the first MPJ of the left foot there is any significant degenerative changes including diminished joint space, spur formation first metatarsal elevation and hypertrophy of the plantar base of the proximal phalanx.  Base the proximal phalanx appears significantly large and prominent plantarly.  Surgical staple intact does not appear to be prominent in the proximal phalanx.   Assessment:   1. Bunion of right foot   2. Hallux rigidus, left foot   3. Hallux limitus of right foot   4. Primary osteoarthritis of both feet     Plan:  Patient was evaluated and treated and all questions answered.  #Right foot Hallux abductovalgus deformity, right foot -XR as above. -Patient has failed all conservative therapy and wishes to proceed with surgical intervention. All risks, benefits, and alternatives discussed with patient. No guarantees given. Marland Kitchen Post-op course explained at length. -Planned procedures: After further evaluation and discussion with the patient I recommend bunionectomy with first metatarsal phalangeal joint arthrodesis on the right foot.  Patient is aware she will no longer have motion in the first MPJ following this procedure.  Believe this will be the best combination of postoperative recovery course and prevent recurrence of the deformity. -Again discussed the patient risk benefits alternatives and possible complications.  Patient understands expected postoperative course including need for approximately 4 to 6 weeks nonweightbearing. -Patient would like to proceed surgical consent obtained at this visit.  Begin surgical planning -Risk factors: Cardiovascular disease and prior DVT -patient  will require Eliquis or Xarelto for DVT prophylaxis postoperatively during the nonweightbearing phase.  #Hallux rigidus of the left first MPJ with prominent plantar first proximal phalanx base -Again discussed possible future surgery on the left foot following the right foot surgery to include first metatarsal phalangeal joint arthrodesis with resection of the base of the proximal phalanx.   Return for After OR.        Everitt Amber, DPM [facility]                   07/13/2022

## 2022-07-14 ENCOUNTER — Telehealth: Payer: Self-pay

## 2022-07-14 NOTE — Telephone Encounter (Signed)
Received surgery paperwork from the [facility] office. Left a message for Lauren Moon to call and schedule surgery with [facility].

## 2022-08-03 DIAGNOSIS — M79676 Pain in unspecified toe(s): Secondary | ICD-10-CM

## 2022-08-06 ENCOUNTER — Telehealth: Payer: Self-pay | Admitting: Podiatry

## 2022-08-06 NOTE — Telephone Encounter (Signed)
DOS: 09/01/2022  BCBS Effective 08/02/2022 [employer] Medicare Effective   Hallux MPJ Fusion Rt (56433)  BCBS Benefits: Deductible: $1,750 with $0 met Out-of-Pocket: $6,850 with $0 met CoInsurance: 0%  Prior authorization is not required per Kayla P. Call Reference #: [id number]  [employer] Benefits: Deductible: $0 Out-of-Pocket: $3,600 with $0 met CoInsurance: 0%  Authorization #: [id number] Tracking #: [id number] Authorization Valid: 09/01/2022 Only

## 2022-08-12 DIAGNOSIS — U071 COVID-19: Secondary | ICD-10-CM | POA: Insufficient documentation

## 2022-08-31 ENCOUNTER — Other Ambulatory Visit (INDEPENDENT_AMBULATORY_CARE_PROVIDER_SITE_OTHER): Payer: BC Managed Care – PPO | Admitting: Podiatry

## 2022-08-31 DIAGNOSIS — Z9889 Other specified postprocedural states: Secondary | ICD-10-CM

## 2022-08-31 DIAGNOSIS — M2022 Hallux rigidus, left foot: Secondary | ICD-10-CM

## 2022-08-31 MED ORDER — OXYCODONE-ACETAMINOPHEN 5-325 MG PO TABS: 1.0000 | ORAL_TABLET | ORAL | 0 refills | Status: AC | PRN

## 2022-08-31 MED ORDER — RIVAROXABAN 10 MG PO TABS: 10.0000 mg | ORAL_TABLET | Freq: Every day | ORAL | 0 refills | Status: AC

## 2022-08-31 MED ORDER — CLINDAMYCIN HCL 300 MG PO CAPS: 300.0000 mg | ORAL_CAPSULE | Freq: Three times a day (TID) | ORAL | 0 refills | Status: AC

## 2022-08-31 NOTE — Progress Notes (Signed)
Postop medications sent 

## 2022-09-01 ENCOUNTER — Encounter: Payer: Self-pay | Admitting: Podiatry

## 2022-09-01 DIAGNOSIS — M2011 Hallux valgus (acquired), right foot: Secondary | ICD-10-CM | POA: Diagnosis not present

## 2022-09-01 DIAGNOSIS — M2021 Hallux rigidus, right foot: Secondary | ICD-10-CM | POA: Diagnosis not present

## 2022-09-07 ENCOUNTER — Ambulatory Visit (INDEPENDENT_AMBULATORY_CARE_PROVIDER_SITE_OTHER): Payer: Medicare HMO

## 2022-09-07 ENCOUNTER — Ambulatory Visit (INDEPENDENT_AMBULATORY_CARE_PROVIDER_SITE_OTHER): Payer: Medicare HMO | Admitting: Podiatry

## 2022-09-07 DIAGNOSIS — M21611 Bunion of right foot: Secondary | ICD-10-CM

## 2022-09-07 DIAGNOSIS — M2021 Hallux rigidus, right foot: Secondary | ICD-10-CM

## 2022-09-07 DIAGNOSIS — Z09 Encounter for follow-up examination after completed treatment for conditions other than malignant neoplasm: Secondary | ICD-10-CM

## 2022-09-07 NOTE — Progress Notes (Signed)
  Subjective:  Patient ID: Lauren Moon, female    DOB: Aug 27, 1945,  MRN: [id number]  Chief Complaint  Patient presents with   Routine Post Op    POV #1 DOS 09/01/2022 RT FOOT BUNIONECTOMY W/ 1ST MPJ ARTHRODESIS    DOS: 09/01/2022 Procedure: Bunionectomy with right first metatarsal phalangeal joint arthrodesis  77 y.o. female returns for post-op check.  Patient presents for first postoperative appointment status post right foot bunionectomy with first metatarsal phalangeal arthrodesis 6 days ago.  She has been doing well she is taking minimal Percocet at this time denies any nausea vomiting fever chills.  Has been wearing the cam boot and kept the dressing clean dry and tracks in surgery.  She has been putting some weight down in the cam boot does not come in with any crutches or knee swelling at this time.  Review of Systems: Negative except as noted in the HPI. Denies N/V/F/Ch.   Objective:  There were no vitals filed for this visit. There is no height or weight on file to calculate BMI. Constitutional Well developed. Well nourished.  Vascular Foot warm and well perfused. Capillary refill normal to all digits.  Calf is soft and supple, no posterior calf or knee pain, negative Homans' sign  Neurologic Normal speech. Oriented to person, place, and time. Epicritic sensation to light touch grossly present bilaterally.  Dermatologic Skin healing well without signs of infection. Skin edges well coapted without signs of infection.  Minimal dried blood at the distal aspect of the incision but no dehiscence or maceration of the skin  Orthopedic: Tenderness to palpation noted about the surgical site.  Edema noted about the first MPJ.   Multiple view plain film radiographs: XR 3 views AP lateral oblique weightbearing 09/07/2022.  Findings: Attention  First metatarsophalangeal joint there is no to be intact crossing compression screw fully threaded headless from the distal medial aspect of the  base of the proximal phalanx to the proximal lateral aspect of the first metatarsal midshaft.  Hallux locking plate and locking screws are intact.  The hallux is in rectus position with the first metatarsal slightly dorsally elevated to approximately 5 degrees in the sagittal plane.  No acute postoperative complication with hardware or any soft tissue emphysema noted. Assessment:   1. Postoperative examination   2. Hallux rigidus, right foot   3. Bunion of right foot    Plan:  Patient was evaluated and treated and all questions answered.  S/p foot surgery right bunionectomy with first MPJ arthrodesis -Progressing as expected post-operatively. -XR: As above no acute postoperative complication hallux in rectus position maintained postoperatively -WB Status: Minimal weightbearing as tolerated in cam boot.  Previously told patient to remain nonweightbearing following surgery but she came in with some weightbearing discussed that he does okay to put some weight down however I want her to stay off her foot for the first 2 weeks is much as she is able to. -Sutures: To remain intact until next visit. -Medications: Continue Percocet as needed continue Xarelto 10 mg daily for DVT prophylaxis as patient -Foot redressed.  With Betadine dressing.  She will remain intact until next visit.  Return in about 1 week (around 09/14/2022) for 2nd POV R foot 1st MPJ arthrodesis.         Everitt Amber, DPM [facility]

## 2022-09-14 ENCOUNTER — Ambulatory Visit (INDEPENDENT_AMBULATORY_CARE_PROVIDER_SITE_OTHER): Payer: Medicare HMO | Admitting: Podiatry

## 2022-09-14 DIAGNOSIS — M21611 Bunion of right foot: Secondary | ICD-10-CM

## 2022-09-14 DIAGNOSIS — Z09 Encounter for follow-up examination after completed treatment for conditions other than malignant neoplasm: Secondary | ICD-10-CM

## 2022-09-14 DIAGNOSIS — M2021 Hallux rigidus, right foot: Secondary | ICD-10-CM

## 2022-09-14 NOTE — Progress Notes (Signed)
  Subjective:  Patient ID: Lauren Moon, female    DOB: 1946/05/15,  MRN: [id number]  Chief Complaint  Patient presents with   Routine Post Op    POV #2 DOS 09/01/2022 RT FOOT BUNIONECTOMY W/ 1ST MPJ ARTHRODESIS    DOS: 09/01/2022 Procedure: Bunionectomy with right first metatarsal phalangeal joint arthrodesis  77 y.o. female returns for second post-op check approximately 2 weeks status post right foot bunionectomy with first MPJ arthrodesis.  She has been improved since last visit.  Has been wearing the cam boot and kept the dressing clean dry and intact since last visit.  Has been ambulating in the cam boot without any issue.  Review of Systems: Negative except as noted in the HPI. Denies N/V/F/Ch.   Objective:  There were no vitals filed for this visit. There is no height or weight on file to calculate BMI. Constitutional Well developed. Well nourished.  Vascular Foot warm and well perfused. Capillary refill normal to all digits.  Calf is soft and supple, no posterior calf or knee pain, negative Homans' sign  Neurologic Normal speech. Oriented to person, place, and time. Epicritic sensation to light touch grossly present bilaterally.  Dermatologic Skin healing well without signs of infection. Skin edges well coapted without signs of infection.  Minimal dried blood at the distal aspect of the incision but no dehiscence or maceration of the skin  Orthopedic: Decreased tenderness to palpation noted about the surgical site.  Edema noted about the first MPJ.   Multiple view plain film radiographs: XR 3 views AP lateral oblique weightbearing 09/07/2022.  Findings: New x-rays deferred at this visit we will take additional x-rays at next visit Assessment:   1. Postoperative examination   2. Hallux rigidus, right foot   3. Bunion of right foot     Plan:  Patient was evaluated and treated and all questions answered.  S/p foot surgery right bunionectomy with first MPJ  arthrodesis -Progressing as expected post-operatively, improving postoperatively no sign of infection complication at this time -XR: Deferred at this visit we will take additional x-rays at next visit -WB Status: Minimal weightbearing as tolerated in cam boot.   -Sutures: Removed at this visit -Medications: Ibuprofen and Tylenol as needed for pain Xarelto 10 mg daily for DVT prophylaxis as patient has history of DVT will continue for another 2 weeks and then discontinue. -Foot redressed.  With Betadine dressing.  Discussed the patient is now okay to wash her right foot with warm soapy water but should dry out thoroughly after and replace with dry gauze covering.  Return in about 2 weeks (around 09/28/2022) for 3rd POV R 1st MPJ fusion.         Everitt Amber, DPM [facility] [place] [facility]

## 2022-09-28 ENCOUNTER — Ambulatory Visit (INDEPENDENT_AMBULATORY_CARE_PROVIDER_SITE_OTHER): Payer: Medicare HMO

## 2022-09-28 ENCOUNTER — Ambulatory Visit (INDEPENDENT_AMBULATORY_CARE_PROVIDER_SITE_OTHER): Payer: Medicare HMO | Admitting: Podiatry

## 2022-09-28 DIAGNOSIS — M2021 Hallux rigidus, right foot: Secondary | ICD-10-CM

## 2022-09-28 DIAGNOSIS — Z09 Encounter for follow-up examination after completed treatment for conditions other than malignant neoplasm: Secondary | ICD-10-CM | POA: Diagnosis not present

## 2022-09-28 NOTE — Progress Notes (Signed)
  Subjective:  Patient ID: Lauren Moon, female    DOB: 1946-05-09,  MRN: [id number]  Chief Complaint  Patient presents with   Routine Post Op    POV #3 Right 1st MPJ fusion.    DOS: 09/01/2022 Procedure: Bunionectomy with right first metatarsal phalangeal joint arthrodesis  77 y.o. female returns for third post-op check approximately 4 weeks status post right foot bunionectomy with first MPJ arthrodesis.  She has been improved since last visit.  Has been ambulating in cam boot.  Has been getting the foot wet washing with warm soapy water.  No concerns.  Pain and swelling have decreased from prior  review of Systems: Negative except as noted in the HPI. Denies N/V/F/Ch.   Objective:  There were no vitals filed for this visit. There is no height or weight on file to calculate BMI. Constitutional Well developed. Well nourished.  Vascular Foot warm and well perfused. Capillary refill normal to all digits.  Calf is soft and supple, no posterior calf or knee pain, negative Homans' sign  Neurologic Normal speech. Oriented to person, place, and time. Epicritic sensation to light touch grossly present bilaterally.  Dermatologic Skin well-healed at this time with minimal scarring  Orthopedic: Decreased tenderness to palpation noted about the surgical site.  Decreased edema about the first MPJ improved from prior   Multiple view plain film radiographs: XR 3 views AP lateral oblique weightbearing 09/07/2022.  Findings: Attention directed to the first metatarsal phalangeal joint there is no to be plate and crossing compression screw intact without evidence of hardware loosening or failure.  There is increased osseous bridging noted across the arthrodesis site improved from prior.  No radiographic evidence of complication at this time.  Hallux is in more rectus alignment than preoperatively.   Assessment:   1. Postoperative examination   2. Hallux rigidus, right foot     Plan:  Patient was  evaluated and treated and all questions answered.  S/p foot surgery right bunionectomy with first MPJ arthrodesis -Progressing as expected post-operatively, improving postoperatively.  Patient is very happy with surgical result at this time does not have any pain at the joint and happy the toe is straighter and not touching her second toe. -XR: As above no evidence of complication at this time improved from prior with increased osseous infill across the arthrodesis site -WB Status: Patient can now transition out of cam boot into a postoperative shoe for the next 2 weeks.  After which she can try to transition to regular shoe gear for another 2 weeks. -Medications: Tylenol as needed for pain can discontinue any use of Xarelto or other DVT prophylaxis -Foot wrapped with Ace wrap for mild edema of the forefoot continue Ace wrap or compression sock for edema control.  Return in about 4 weeks (around 10/26/2022) for 4th POV 8 weeks POV.         Everitt Amber, DPM [facility]

## 2022-10-26 ENCOUNTER — Ambulatory Visit (INDEPENDENT_AMBULATORY_CARE_PROVIDER_SITE_OTHER): Payer: Medicare HMO | Admitting: Podiatry

## 2022-10-26 DIAGNOSIS — M2022 Hallux rigidus, left foot: Secondary | ICD-10-CM

## 2022-10-26 DIAGNOSIS — M205X1 Other deformities of toe(s) (acquired), right foot: Secondary | ICD-10-CM

## 2022-10-26 DIAGNOSIS — Z9889 Other specified postprocedural states: Secondary | ICD-10-CM

## 2022-10-26 NOTE — Progress Notes (Signed)
  Subjective:  Patient ID: Lauren Moon, female    DOB: Nov 22, 1945,  MRN: [id number]  Chief Complaint  Patient presents with   Routine Post Op    Pt stated that she is doing well she does have some swelling     DOS: 09/01/2022 Procedure: Bunionectomy with right first metatarsal phalangeal joint arthrodesis  77 y.o. female returns for 4th post-op check approximately 8 weeks status post right foot bunionectomy with first MPJ arthrodesis.  Patient is now back to wearing regular shoes and walking without difficulty.  She does note some mild edema around the first MPJ that was fused however this is sporadic and she is using a compression stocking and Ace wrap to help with this.  She says there is only occasional pain in the first MPJ however not often.  Objective:  There were no vitals filed for this visit. There is no height or weight on file to calculate BMI. Constitutional Well developed. Well nourished.  Vascular Foot warm and well perfused. Capillary refill normal to all digits.  Calf is soft and supple, no posterior calf or knee pain, negative Homans' sign  Neurologic Normal speech. Oriented to person, place, and time. Epicritic sensation to light touch grossly present bilaterally.  Dermatologic Skin well-healed at this time with minimal scarring  Orthopedic: Minimal to no tenderness to palpation noted about the surgical site.  Decreased edema about the first MPJ improved from prior   Multiple view plain film radiographs: XR deferred at this visit we will take new x-rays at next appointment Assessment:   1. Post-operative state   2. Hallux rigidus, left foot   3. Hallux limitus of right foot      Plan:  Patient was evaluated and treated and all questions answered.  S/p foot surgery right bunionectomy with first MPJ arthrodesis -Progressing as expected post-operatively, improving postoperatively.  Patient remains happy with the surgical outcome -XR: Deferred at this visit  will take final radiographs at next appointment -WB Status: Continue to be weightbearing as tolerated in regular shoes -Recommend compression stocking for edema control -Patient is now okay to return to work and be weightbearing as tolerated in regular shoes at work as long as it does not cause or aggravate pain in the right foot  Return in about 4 weeks (around 11/23/2022) for 5th POV R 1st MPJ arthrodesis.         Everitt Amber, DPM [facility]

## 2022-11-23 ENCOUNTER — Ambulatory Visit (INDEPENDENT_AMBULATORY_CARE_PROVIDER_SITE_OTHER): Payer: Medicare HMO | Admitting: Podiatry

## 2022-11-23 DIAGNOSIS — Z91199 Patient's noncompliance with other medical treatment and regimen due to unspecified reason: Secondary | ICD-10-CM

## 2022-11-23 NOTE — Progress Notes (Signed)
Pt was a no show for apt, no charge 

## 2022-11-30 ENCOUNTER — Ambulatory Visit: Payer: Medicare HMO | Admitting: Podiatry

## 2022-11-30 ENCOUNTER — Ambulatory Visit (INDEPENDENT_AMBULATORY_CARE_PROVIDER_SITE_OTHER): Payer: Medicare HMO

## 2022-11-30 DIAGNOSIS — Z9889 Other specified postprocedural states: Secondary | ICD-10-CM

## 2022-11-30 DIAGNOSIS — M205X1 Other deformities of toe(s) (acquired), right foot: Secondary | ICD-10-CM | POA: Diagnosis not present

## 2022-11-30 DIAGNOSIS — M2022 Hallux rigidus, left foot: Secondary | ICD-10-CM

## 2022-11-30 MED ORDER — CICLOPIROX 8 % EX SOLN: Freq: Every day | CUTANEOUS | 0 refills | Status: AC

## 2022-11-30 NOTE — Progress Notes (Signed)
  Subjective:  Patient ID: Lauren Moon, female    DOB: October 18, 1945,  MRN: [id number]  Chief Complaint  Patient presents with   Routine Post Op    5th POV R 1st MPJ arthrodesis.    DOS: 09/01/2022 Procedure: Bunionectomy with right first metatarsal phalangeal joint arthrodesis  77 y.o. female returns for 5th post-op check approximately 12 weeks status post right foot bunionectomy with first MPJ arthrodesis.  Patient reports she is doing very well with regards to her right foot surgery.  She is not having any pain at the joint at this point in time.  Denies any swelling or any other issues on the right foot first MPJ.  Objective:  There were no vitals filed for this visit. There is no height or weight on file to calculate BMI. Constitutional Well developed. Well nourished.  Vascular Foot warm and well perfused. Capillary refill normal to all digits.  Calf is soft and supple, no posterior calf or knee pain, negative Homans' sign  Neurologic Normal speech. Oriented to person, place, and time. Epicritic sensation to light touch grossly present bilaterally.  Dermatologic Skin well-healed at this time with minimal scarring  Orthopedic: Minimal to no tenderness to palpation noted about the surgical site.  Decreased edema about the first MPJ improved from prior   Multiple view plain film radiographs: 11/30/22 XR 3 views AP lateral oblique left foot.  Weightbearing findings: Attention directed the first metatarsophalangeal joint there is no to be complete osseous infill across the joint with no evidence of nonunion or malunion.  The hardware is in normal alignment with no evidence of hardware breakage or complication.  Overall consistent with near complete osseous fusion of the first metatarsal phalangeal joint approximately 12 weeks status post arthrodesis Assessment:   1. Post-operative state   2. Hallux rigidus, left foot      Plan:  Patient was evaluated and treated and all questions  answered.  S/p foot surgery right bunionectomy with first MPJ arthrodesis -Progressing as expected post-operatively, fully healed at this time, patient remains happy with the surgical outcome -XR: X-ray as above no concern for postoperative complication hardware is intact with no issues -WB Status: Continue to be weightbearing as tolerated in regular shoes, no further limitations in regards to length or distance walking -Recommend compression stocking for edema control -Patient is now fully recovered from her first MPJ arthrodesis on the right foot she is doing well with no issues.  She does not have any pain swelling or other problems at the first MPJ.  X-rays show osseous healing is near complete she is now discharged from postoperative care in regards to this surgery. -Discussed that if she has any future issues at the surgical site including redness swelling or pain she should call and we will get her back in for further evaluation  # Onychomycosis -Recommend topical Penlac 8% solution apply daily to all nails for antifungal treatment        Corinna Gab, DPM [facility] / [facility]

## 2023-09-26 ENCOUNTER — Ambulatory Visit: Payer: Medicare Other | Admitting: Podiatry

## 2023-09-26 ENCOUNTER — Ambulatory Visit (INDEPENDENT_AMBULATORY_CARE_PROVIDER_SITE_OTHER): Payer: Medicare Other

## 2023-09-26 ENCOUNTER — Encounter: Payer: Self-pay | Admitting: Podiatry

## 2023-09-26 DIAGNOSIS — M19072 Primary osteoarthritis, left ankle and foot: Secondary | ICD-10-CM | POA: Diagnosis not present

## 2023-09-26 DIAGNOSIS — M79672 Pain in left foot: Secondary | ICD-10-CM

## 2023-09-26 DIAGNOSIS — M21072 Valgus deformity, not elsewhere classified, left ankle: Secondary | ICD-10-CM | POA: Diagnosis not present

## 2023-09-26 DIAGNOSIS — M2142 Flat foot [pes planus] (acquired), left foot: Secondary | ICD-10-CM

## 2023-09-26 DIAGNOSIS — M19079 Primary osteoarthritis, unspecified ankle and foot: Secondary | ICD-10-CM | POA: Diagnosis not present

## 2023-09-26 MED ORDER — MELOXICAM 15 MG PO TABS: 15.0000 mg | ORAL_TABLET | Freq: Every day | ORAL | 0 refills | Status: DC

## 2023-09-26 NOTE — Progress Notes (Unsigned)
 Chief Complaint  Patient presents with   Foot Pain    Left foot, arch. There is a callous build up on the medial side of the arch which is where her pain is. Pain started about 3 weeks ago. Its aching more then throbbing. [facility] and [facility] gave her new inserts which hurt her so bad she took them back . Not diabetic, and no anti coag.     HPI: 78 y.o. female presents today painful left foot deformity.  She has had collapsed arches for some time.  She tried getting inserts from the [facility] which caused painful callus.  She does have left foot of the right foot as well, this is not as severe.  Past Medical History:  Diagnosis Date   Abnormal nuclear stress test 04/24/2019   Acute deep vein thrombosis (DVT) of popliteal vein of right lower extremity (HCC) 04/24/2019   Acute venous embolism and thrombosis of unspecified deep vessels of lower extremity    Arthritis    Benign hypertension 11/05/2015   Overview:  1986   Bilateral lower extremity edema 02/08/2017   Overview:  2018   Chest pain, atypical 04/24/2019   Heart murmur    Hypertension    Left rotator cuff tear arthropathy 10/19/2016   Lumbago    Mixed hyperlipidemia 11/05/2015   Varicose veins of leg with complications 05/06/2014   Varicose veins of lower extremities with other complications 04/16/2014   Varicose veins of lower extremity 04/16/2014    Past Surgical History:  Procedure Laterality Date   ABDOMINAL HYSTERECTOMY     ovaries removed as well   CATARACT EXTRACTION W/ INTRAOCULAR LENS IMPLANT Bilateral    Done at [facility]   FOOT SURGERY Left 06/11/2019   JOINT REPLACEMENT Bilateral    knee, 1997, 2011   SHOULDER ARTHROSCOPY     "cleaned my left shoulder out"    TOTAL SHOULDER ARTHROPLASTY Left 09/2016   TOTAL SHOULDER ARTHROPLASTY Left 10/19/2016   Procedure: TOTAL SHOULDER ARTHROPLASTY;  Surgeon: Teryl Lucy, MD;  Location: [facility] OR;  Service: Orthopedics;  Laterality: Left;    TUBAL LIGATION     VARICOSE VEIN SURGERY      Allergies  Allergen Reactions   Atorvastatin     Other reaction(s): Other (See Comments) 12/03/2020: Elevated CK with thigh pain, uncertain connection   Gabapentin     Other reaction(s): Other (See Comments) Muscles cramp   Penicillins    Penicillin G Rash    ROS denies any nausea, vomiting, fever, chills, chest pain, shortness of breath   Physical Exam: There were no vitals filed for this visit.  General: The patient is alert and oriented x3 in no acute distress.  Dermatology: Painful callus left plantar medial arch first TMT level  Vascular: Palpable pedal pulses bilaterally. Capillary refill within normal limits.  Lower extremities edematous left greater than right.  Neurological: Light touch sensation grossly intact bilateral feet.   Musculoskeletal Exam: Rigid pes planus deformity bilaterally left greater than right.  Decreased subtalar joint inversion and eversion.  Minimal midtarsal joint motion.  Unable to perform double heel rise without pain.  Heels do not reduce.  There is some valgus alignment of the left ankle.  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  Hallux limitus noted as well.  Radiographic Exam: Left foot 3 views weightbearing 09/22/2023 Osteopenia noted.  Severe hindfoot arthritis, subtalar joint arthritis with sagging of the subtalar joint  and TN joint.  Diffuse dorsal spurring present severe talar head uncoverage.  Significant forefoot abduction.  First MPJ arthritis with elevated first ray with first metatarsal head subluxed dorsally.  Severe pes planovalgus foot type.  Retained hardware from prior Aiken osteotomy  Assessment/Plan of Care: 1. Arthritis of left subtalar joint   2. Osteoarthritis of talonavicular joint   3. Acquired rigid flat foot, left   4. Acquired valgus deformity of left ankle      Meds ordered this encounter  Medications   meloxicam (MOBIC) 15 MG tablet    Sig: Take 1 tablet (15  mg total) by mouth daily.    Dispense:  30 tablet    Refill:  0   None  Discussed clinical findings with patient today.  Radiographs reviewed with patient.  Discussed extent of her deformity.  She would need custom bracing to help support her pes planovalgus with ankle valgus.  Do not think that her deformity would tolerate an off-the-shelf products.  She has very limited mobility of these joints and the deformity is nonreducible.  Order placed for custom richie/arizona brace for now.  Discussed with patient that I am unsure of what out-of-pocket cost would be for this.  They are holding off on scheduling a pending appointment.  They may look at over-the-counter options see if anything will be helpful.  Course of oral meloxicam for now.   Ammon Muscatello L. Marchia Bond, AACFAS [facility]     [address] [place], [place] [address]                Office [phone]  Fax [phone]

## 2023-12-01 ENCOUNTER — Other Ambulatory Visit: Payer: Self-pay | Admitting: Podiatry

## 2023-12-01 DIAGNOSIS — M19072 Primary osteoarthritis, left ankle and foot: Secondary | ICD-10-CM

## 2023-12-27 ENCOUNTER — Other Ambulatory Visit: Payer: Self-pay | Admitting: Podiatry

## 2023-12-27 DIAGNOSIS — M19072 Primary osteoarthritis, left ankle and foot: Secondary | ICD-10-CM

## 2024-01-28 ENCOUNTER — Other Ambulatory Visit: Payer: Self-pay | Admitting: Podiatry

## 2024-01-28 DIAGNOSIS — M19072 Primary osteoarthritis, left ankle and foot: Secondary | ICD-10-CM

## 2024-02-01 ENCOUNTER — Telehealth: Payer: Self-pay | Admitting: Urology

## 2024-02-01 NOTE — Telephone Encounter (Signed)
 Pt is requesting a refill on the Meloxicam .  Pharmacy - [facility] in [place], [place]   Call back # [phone]

## 2024-02-06 NOTE — Telephone Encounter (Signed)
 Pt called again, following up if refill can me sent in. See below. Thanks
